# Patient Record
Sex: Female | Born: 1955 | Race: Black or African American | Hispanic: No | Marital: Married | Smoking: Former smoker
Health system: Southern US, Community
[De-identification: ages and names within clinical notes are randomized; demographics above are authoritative.]

## PROBLEM LIST (undated history)

## (undated) DIAGNOSIS — I10 Essential (primary) hypertension: Secondary | ICD-10-CM

## (undated) DIAGNOSIS — M25579 Pain in unspecified ankle and joints of unspecified foot: Secondary | ICD-10-CM

## (undated) DIAGNOSIS — R319 Hematuria, unspecified: Secondary | ICD-10-CM

## (undated) DIAGNOSIS — M51369 Other intervertebral disc degeneration, lumbar region without mention of lumbar back pain or lower extremity pain: Secondary | ICD-10-CM

## (undated) DIAGNOSIS — R1310 Dysphagia, unspecified: Secondary | ICD-10-CM

## (undated) DIAGNOSIS — M79603 Pain in arm, unspecified: Secondary | ICD-10-CM

## (undated) DIAGNOSIS — N200 Calculus of kidney: Secondary | ICD-10-CM

## (undated) DIAGNOSIS — M7660 Achilles tendinitis, unspecified leg: Secondary | ICD-10-CM

## (undated) DIAGNOSIS — G4700 Insomnia, unspecified: Secondary | ICD-10-CM

## (undated) DIAGNOSIS — F32A Depression, unspecified: Secondary | ICD-10-CM

## (undated) DIAGNOSIS — J309 Allergic rhinitis, unspecified: Secondary | ICD-10-CM

## (undated) DIAGNOSIS — Z8249 Family history of ischemic heart disease and other diseases of the circulatory system: Secondary | ICD-10-CM

## (undated) DIAGNOSIS — R0789 Other chest pain: Secondary | ICD-10-CM

## (undated) DIAGNOSIS — M5136 Other intervertebral disc degeneration, lumbar region: Secondary | ICD-10-CM

## (undated) DIAGNOSIS — E669 Obesity, unspecified: Secondary | ICD-10-CM

## (undated) DIAGNOSIS — G44009 Cluster headache syndrome, unspecified, not intractable: Secondary | ICD-10-CM

## (undated) DIAGNOSIS — E039 Hypothyroidism, unspecified: Secondary | ICD-10-CM

## (undated) DIAGNOSIS — N83209 Unspecified ovarian cyst, unspecified side: Secondary | ICD-10-CM

## (undated) DIAGNOSIS — K219 Gastro-esophageal reflux disease without esophagitis: Secondary | ICD-10-CM

## (undated) DIAGNOSIS — G5600 Carpal tunnel syndrome, unspecified upper limb: Secondary | ICD-10-CM

## (undated) DIAGNOSIS — Z780 Asymptomatic menopausal state: Secondary | ICD-10-CM

## (undated) DIAGNOSIS — E119 Type 2 diabetes mellitus without complications: Secondary | ICD-10-CM

## (undated) HISTORY — DX: Asymptomatic menopausal state: Z78.0

## (undated) HISTORY — DX: Allergic rhinitis, unspecified: J30.9

## (undated) HISTORY — DX: Obesity, unspecified: E66.9

## (undated) HISTORY — DX: Calculus of kidney: N20.0

## (undated) HISTORY — PX: REPLACEMENT TOTAL KNEE: SUR1224

## (undated) HISTORY — DX: Dysphagia, unspecified: R13.10

## (undated) HISTORY — DX: Insomnia, unspecified: G47.00

## (undated) HISTORY — DX: Achilles tendinitis, unspecified leg: M76.60

## (undated) HISTORY — DX: Other intervertebral disc degeneration, lumbar region without mention of lumbar back pain or lower extremity pain: M51.369

## (undated) HISTORY — DX: Family history of ischemic heart disease and other diseases of the circulatory system: Z82.49

## (undated) HISTORY — DX: Unspecified ovarian cyst, unspecified side: N83.209

## (undated) HISTORY — DX: Hematuria, unspecified: R31.9

## (undated) HISTORY — DX: Pain in unspecified ankle and joints of unspecified foot: M25.579

## (undated) HISTORY — DX: Depression, unspecified: F32.A

## (undated) HISTORY — DX: Other chest pain: R07.89

## (undated) HISTORY — DX: Other intervertebral disc degeneration, lumbar region: M51.36

## (undated) HISTORY — DX: Cluster headache syndrome, unspecified, not intractable: G44.009

## (undated) HISTORY — PX: LAPAROSCOPIC TOTAL HYSTERECTOMY: SUR800

## (undated) HISTORY — DX: Pain in arm, unspecified: M79.603

## (undated) HISTORY — DX: Gastro-esophageal reflux disease without esophagitis: K21.9

## (undated) HISTORY — DX: Carpal tunnel syndrome, unspecified upper limb: G56.00

---

## 2002-09-26 ENCOUNTER — Encounter: Admission: RE | Admit: 2002-09-26 | Discharge: 2002-12-25 | Payer: Self-pay | Admitting: Internal Medicine

## 2002-12-26 ENCOUNTER — Encounter: Admission: RE | Admit: 2002-12-26 | Discharge: 2003-03-26 | Payer: Self-pay | Admitting: Internal Medicine

## 2004-01-28 ENCOUNTER — Other Ambulatory Visit: Admission: RE | Admit: 2004-01-28 | Discharge: 2004-01-28 | Payer: Self-pay | Admitting: Internal Medicine

## 2007-05-03 ENCOUNTER — Encounter: Admission: RE | Admit: 2007-05-03 | Discharge: 2007-05-03 | Payer: Self-pay | Admitting: Internal Medicine

## 2008-12-26 ENCOUNTER — Encounter: Admission: RE | Admit: 2008-12-26 | Discharge: 2008-12-26 | Payer: Self-pay | Admitting: Internal Medicine

## 2009-03-19 ENCOUNTER — Encounter: Admission: RE | Admit: 2009-03-19 | Discharge: 2009-03-19 | Payer: Self-pay | Admitting: Internal Medicine

## 2010-09-24 ENCOUNTER — Encounter: Admission: RE | Admit: 2010-09-24 | Discharge: 2010-09-24 | Payer: Self-pay | Admitting: Internal Medicine

## 2010-11-15 ENCOUNTER — Encounter: Payer: Self-pay | Admitting: Internal Medicine

## 2011-10-04 ENCOUNTER — Other Ambulatory Visit: Payer: Self-pay | Admitting: Obstetrics and Gynecology

## 2011-10-04 DIAGNOSIS — Z1231 Encounter for screening mammogram for malignant neoplasm of breast: Secondary | ICD-10-CM

## 2011-10-14 ENCOUNTER — Ambulatory Visit: Payer: Self-pay

## 2011-11-01 ENCOUNTER — Ambulatory Visit
Admission: RE | Admit: 2011-11-01 | Discharge: 2011-11-01 | Disposition: A | Discharge status: Home / Self Care | Payer: PRIVATE HEALTH INSURANCE | Source: Ambulatory Visit | Attending: Obstetrics and Gynecology | Admitting: Obstetrics and Gynecology

## 2011-11-01 DIAGNOSIS — Z1231 Encounter for screening mammogram for malignant neoplasm of breast: Secondary | ICD-10-CM

## 2011-11-05 ENCOUNTER — Other Ambulatory Visit: Payer: Self-pay | Admitting: Obstetrics and Gynecology

## 2011-11-05 DIAGNOSIS — R928 Other abnormal and inconclusive findings on diagnostic imaging of breast: Secondary | ICD-10-CM

## 2011-11-17 ENCOUNTER — Ambulatory Visit
Admission: RE | Admit: 2011-11-17 | Discharge: 2011-11-17 | Disposition: A | Discharge status: Home / Self Care | Payer: PRIVATE HEALTH INSURANCE | Source: Ambulatory Visit | Attending: Obstetrics and Gynecology | Admitting: Obstetrics and Gynecology

## 2011-11-17 DIAGNOSIS — R928 Other abnormal and inconclusive findings on diagnostic imaging of breast: Secondary | ICD-10-CM

## 2013-01-24 ENCOUNTER — Other Ambulatory Visit: Payer: Self-pay

## 2013-01-24 DIAGNOSIS — Z1231 Encounter for screening mammogram for malignant neoplasm of breast: Secondary | ICD-10-CM

## 2013-01-30 ENCOUNTER — Ambulatory Visit
Admission: RE | Admit: 2013-01-30 | Discharge: 2013-01-30 | Disposition: A | Discharge status: Home / Self Care | Payer: BC Managed Care – PPO | Source: Ambulatory Visit

## 2013-01-30 DIAGNOSIS — Z1231 Encounter for screening mammogram for malignant neoplasm of breast: Secondary | ICD-10-CM

## 2013-11-16 ENCOUNTER — Other Ambulatory Visit: Payer: Self-pay

## 2013-11-16 DIAGNOSIS — Z1231 Encounter for screening mammogram for malignant neoplasm of breast: Secondary | ICD-10-CM

## 2014-02-01 ENCOUNTER — Ambulatory Visit
Admission: RE | Admit: 2014-02-01 | Discharge: 2014-02-01 | Disposition: A | Discharge status: Home / Self Care | Payer: BC Managed Care – PPO | Source: Ambulatory Visit

## 2014-02-01 DIAGNOSIS — Z1231 Encounter for screening mammogram for malignant neoplasm of breast: Secondary | ICD-10-CM

## 2014-02-14 ENCOUNTER — Ambulatory Visit
Admission: RE | Admit: 2014-02-14 | Discharge: 2014-02-14 | Disposition: A | Discharge status: Home / Self Care | Payer: BC Managed Care – PPO | Source: Ambulatory Visit | Attending: Internal Medicine | Admitting: Internal Medicine

## 2014-02-14 ENCOUNTER — Other Ambulatory Visit: Payer: Self-pay | Admitting: Internal Medicine

## 2014-02-14 DIAGNOSIS — M79609 Pain in unspecified limb: Secondary | ICD-10-CM

## 2014-02-20 ENCOUNTER — Other Ambulatory Visit: Payer: Self-pay | Admitting: Internal Medicine

## 2014-02-20 DIAGNOSIS — M5412 Radiculopathy, cervical region: Secondary | ICD-10-CM

## 2014-02-23 ENCOUNTER — Other Ambulatory Visit: Payer: BC Managed Care – PPO

## 2014-03-02 ENCOUNTER — Ambulatory Visit
Admission: RE | Admit: 2014-03-02 | Discharge: 2014-03-02 | Disposition: A | Discharge status: Home / Self Care | Payer: BC Managed Care – PPO | Source: Ambulatory Visit | Attending: Internal Medicine | Admitting: Internal Medicine

## 2014-03-02 DIAGNOSIS — M5412 Radiculopathy, cervical region: Secondary | ICD-10-CM

## 2014-12-04 ENCOUNTER — Other Ambulatory Visit: Payer: Self-pay | Admitting: Gastroenterology

## 2014-12-04 DIAGNOSIS — R1310 Dysphagia, unspecified: Secondary | ICD-10-CM

## 2014-12-06 ENCOUNTER — Ambulatory Visit
Admission: RE | Admit: 2014-12-06 | Discharge: 2014-12-06 | Disposition: A | Discharge status: Home / Self Care | Payer: BLUE CROSS/BLUE SHIELD | Source: Ambulatory Visit | Attending: Gastroenterology | Admitting: Gastroenterology

## 2014-12-06 DIAGNOSIS — R1310 Dysphagia, unspecified: Secondary | ICD-10-CM

## 2014-12-09 ENCOUNTER — Other Ambulatory Visit: Payer: Self-pay | Admitting: Gastroenterology

## 2014-12-30 ENCOUNTER — Encounter (HOSPITAL_COMMUNITY): Payer: Self-pay

## 2014-12-30 ENCOUNTER — Ambulatory Visit (HOSPITAL_COMMUNITY): Admit: 2014-12-30 | Payer: Self-pay | Admitting: Gastroenterology

## 2014-12-30 SURGERY — EGD (ESOPHAGOGASTRODUODENOSCOPY)
Anesthesia: Moderate Sedation

## 2015-02-26 ENCOUNTER — Other Ambulatory Visit: Payer: Self-pay

## 2015-02-26 DIAGNOSIS — Z1231 Encounter for screening mammogram for malignant neoplasm of breast: Secondary | ICD-10-CM

## 2015-03-04 ENCOUNTER — Ambulatory Visit
Admission: RE | Admit: 2015-03-04 | Discharge: 2015-03-04 | Disposition: A | Discharge status: Home / Self Care | Payer: BLUE CROSS/BLUE SHIELD | Source: Ambulatory Visit

## 2015-03-04 DIAGNOSIS — Z1231 Encounter for screening mammogram for malignant neoplasm of breast: Secondary | ICD-10-CM

## 2016-02-03 ENCOUNTER — Other Ambulatory Visit: Payer: Self-pay

## 2016-02-03 DIAGNOSIS — Z1231 Encounter for screening mammogram for malignant neoplasm of breast: Secondary | ICD-10-CM

## 2016-03-04 ENCOUNTER — Ambulatory Visit
Admission: RE | Admit: 2016-03-04 | Discharge: 2016-03-04 | Disposition: A | Discharge status: Home / Self Care | Payer: BLUE CROSS/BLUE SHIELD | Source: Ambulatory Visit

## 2016-03-04 DIAGNOSIS — Z1231 Encounter for screening mammogram for malignant neoplasm of breast: Secondary | ICD-10-CM

## 2017-02-10 ENCOUNTER — Other Ambulatory Visit: Payer: Self-pay | Admitting: Obstetrics and Gynecology

## 2017-02-10 DIAGNOSIS — Z1231 Encounter for screening mammogram for malignant neoplasm of breast: Secondary | ICD-10-CM

## 2017-03-07 ENCOUNTER — Ambulatory Visit
Admission: RE | Admit: 2017-03-07 | Discharge: 2017-03-07 | Disposition: A | Discharge status: Home / Self Care | Payer: No Typology Code available for payment source | Source: Ambulatory Visit | Attending: Obstetrics and Gynecology | Admitting: Obstetrics and Gynecology

## 2017-03-07 DIAGNOSIS — Z1231 Encounter for screening mammogram for malignant neoplasm of breast: Secondary | ICD-10-CM

## 2017-08-23 ENCOUNTER — Other Ambulatory Visit: Payer: Self-pay | Admitting: Neurological Surgery

## 2017-08-23 DIAGNOSIS — M542 Cervicalgia: Secondary | ICD-10-CM

## 2017-09-03 ENCOUNTER — Ambulatory Visit
Admission: RE | Admit: 2017-09-03 | Discharge: 2017-09-03 | Disposition: A | Discharge status: Home / Self Care | Payer: No Typology Code available for payment source | Source: Ambulatory Visit | Attending: Neurological Surgery | Admitting: Neurological Surgery

## 2017-09-03 DIAGNOSIS — M542 Cervicalgia: Secondary | ICD-10-CM

## 2018-03-06 ENCOUNTER — Other Ambulatory Visit: Payer: Self-pay | Admitting: Internal Medicine

## 2018-03-06 DIAGNOSIS — Z1231 Encounter for screening mammogram for malignant neoplasm of breast: Secondary | ICD-10-CM

## 2018-03-22 ENCOUNTER — Ambulatory Visit
Admission: RE | Admit: 2018-03-22 | Discharge: 2018-03-22 | Disposition: A | Discharge status: Home / Self Care | Payer: PRIVATE HEALTH INSURANCE | Source: Ambulatory Visit | Attending: Internal Medicine | Admitting: Internal Medicine

## 2018-03-22 DIAGNOSIS — Z1231 Encounter for screening mammogram for malignant neoplasm of breast: Secondary | ICD-10-CM

## 2018-04-05 DIAGNOSIS — M7660 Achilles tendinitis, unspecified leg: Secondary | ICD-10-CM | POA: Insufficient documentation

## 2019-03-01 ENCOUNTER — Other Ambulatory Visit: Payer: Self-pay | Admitting: Internal Medicine

## 2019-03-01 DIAGNOSIS — Z1231 Encounter for screening mammogram for malignant neoplasm of breast: Secondary | ICD-10-CM

## 2019-04-23 ENCOUNTER — Ambulatory Visit: Payer: PRIVATE HEALTH INSURANCE

## 2019-04-24 ENCOUNTER — Other Ambulatory Visit: Payer: Self-pay

## 2019-04-24 ENCOUNTER — Ambulatory Visit
Admission: RE | Admit: 2019-04-24 | Discharge: 2019-04-24 | Disposition: A | Discharge status: Home / Self Care | Payer: PRIVATE HEALTH INSURANCE | Source: Ambulatory Visit | Attending: Internal Medicine | Admitting: Internal Medicine

## 2019-04-24 DIAGNOSIS — Z1231 Encounter for screening mammogram for malignant neoplasm of breast: Secondary | ICD-10-CM

## 2019-12-31 DIAGNOSIS — M1711 Unilateral primary osteoarthritis, right knee: Secondary | ICD-10-CM | POA: Insufficient documentation

## 2019-12-31 HISTORY — DX: Unilateral primary osteoarthritis, right knee: M17.11

## 2020-02-04 ENCOUNTER — Other Ambulatory Visit (HOSPITAL_COMMUNITY): Payer: Self-pay | Admitting: Internal Medicine

## 2020-03-21 ENCOUNTER — Other Ambulatory Visit (HOSPITAL_COMMUNITY): Payer: Self-pay | Admitting: Internal Medicine

## 2020-04-03 ENCOUNTER — Other Ambulatory Visit: Payer: Self-pay | Admitting: Internal Medicine

## 2020-04-03 DIAGNOSIS — Z1231 Encounter for screening mammogram for malignant neoplasm of breast: Secondary | ICD-10-CM

## 2020-04-24 ENCOUNTER — Ambulatory Visit
Admission: RE | Admit: 2020-04-24 | Discharge: 2020-04-24 | Disposition: A | Discharge status: Home / Self Care | Payer: PRIVATE HEALTH INSURANCE | Source: Ambulatory Visit | Attending: Internal Medicine | Admitting: Internal Medicine

## 2020-04-24 ENCOUNTER — Other Ambulatory Visit: Payer: Self-pay

## 2020-04-24 DIAGNOSIS — Z1231 Encounter for screening mammogram for malignant neoplasm of breast: Secondary | ICD-10-CM

## 2020-05-16 ENCOUNTER — Other Ambulatory Visit (HOSPITAL_COMMUNITY): Payer: Self-pay | Admitting: Internal Medicine

## 2020-08-19 ENCOUNTER — Other Ambulatory Visit (HOSPITAL_COMMUNITY): Payer: Self-pay | Admitting: Internal Medicine

## 2020-08-25 ENCOUNTER — Other Ambulatory Visit (HOSPITAL_COMMUNITY): Payer: Self-pay | Admitting: Internal Medicine

## 2020-09-29 ENCOUNTER — Other Ambulatory Visit (HOSPITAL_COMMUNITY): Payer: Self-pay | Admitting: Internal Medicine

## 2020-10-01 ENCOUNTER — Other Ambulatory Visit (HOSPITAL_COMMUNITY): Payer: Self-pay | Admitting: Internal Medicine

## 2020-10-01 ENCOUNTER — Ambulatory Visit: Payer: PRIVATE HEALTH INSURANCE

## 2020-10-01 ENCOUNTER — Ambulatory Visit: Payer: PRIVATE HEALTH INSURANCE | Attending: Internal Medicine

## 2020-10-01 DIAGNOSIS — Z23 Encounter for immunization: Secondary | ICD-10-CM

## 2020-10-01 NOTE — Progress Notes (Signed)
   Covid-19 Vaccination Clinic  Name:  Katie Jefferson    MRN: [id number] DOB: 12/18/1955  10/01/2020  [name] was observed post Covid-19 immunization for 15 minutes without incident. She was provided with Vaccine Information Sheet and instruction to access the V-Safe system.   Vaccinated by [facility]  [name] was instructed to call 911 with any severe reactions post vaccine: Marland Kitchen Difficulty breathing  . Swelling of face and throat  . A fast heartbeat  . A bad rash all over body  . Dizziness and weakness   Immunizations Administered    Name Date Dose VIS Date Route   Pfizer COVID-19 Vaccine 10/01/2020 10:15 AM 0.3 mL 08/13/2020 Intramuscular   Manufacturer: [employer], [employer]   Lot: [id number]   NDC: [address]-4

## 2020-11-19 ENCOUNTER — Other Ambulatory Visit (HOSPITAL_COMMUNITY): Payer: Self-pay | Admitting: Internal Medicine

## 2020-12-15 ENCOUNTER — Other Ambulatory Visit (HOSPITAL_COMMUNITY): Payer: Self-pay | Admitting: Internal Medicine

## 2021-01-22 ENCOUNTER — Other Ambulatory Visit (HOSPITAL_COMMUNITY): Payer: Self-pay | Admitting: Gastroenterology

## 2021-02-03 ENCOUNTER — Other Ambulatory Visit (HOSPITAL_COMMUNITY): Payer: Self-pay

## 2021-02-04 ENCOUNTER — Other Ambulatory Visit (HOSPITAL_COMMUNITY): Payer: Self-pay

## 2021-02-05 ENCOUNTER — Other Ambulatory Visit (HOSPITAL_COMMUNITY): Payer: Self-pay

## 2021-02-05 MED ORDER — LEVOTHYROXINE SODIUM 75 MCG PO TABS
ORAL_TABLET | ORAL | 3 refills | Status: DC
  Filled 2021-02-05: qty 90, 90d supply, fill #0
  Filled 2021-05-08: qty 90, 90d supply, fill #1
  Filled 2021-08-25: qty 90, 90d supply, fill #2
  Filled 2021-11-27: qty 90, 90d supply, fill #0

## 2021-02-13 ENCOUNTER — Other Ambulatory Visit (HOSPITAL_COMMUNITY): Payer: Self-pay

## 2021-02-27 ENCOUNTER — Other Ambulatory Visit (HOSPITAL_COMMUNITY): Payer: Self-pay

## 2021-02-27 MED FILL — Valsartan-Hydrochlorothiazide Tab 80-12.5 MG: ORAL | 60 days supply | Qty: 60 | Fill #0 | Status: AC

## 2021-02-27 MED FILL — Cetirizine HCl Tab 10 MG: ORAL | 30 days supply | Qty: 30 | Fill #0 | Status: AC

## 2021-03-19 ENCOUNTER — Other Ambulatory Visit (HOSPITAL_COMMUNITY): Payer: Self-pay

## 2021-03-19 MED ORDER — AMLODIPINE BESYLATE 2.5 MG PO TABS
2.5000 mg | ORAL_TABLET | Freq: Every day | ORAL | 3 refills | Status: DC
  Filled 2021-03-19: qty 90, 90d supply, fill #0
  Filled 2021-07-03 – 2021-07-06 (×2): qty 90, 90d supply, fill #1
  Filled 2021-10-12: qty 90, 90d supply, fill #2
  Filled 2022-01-22: qty 90, 90d supply, fill #0

## 2021-04-15 ENCOUNTER — Other Ambulatory Visit (HOSPITAL_COMMUNITY): Payer: Self-pay

## 2021-04-15 MED ORDER — CETIRIZINE HCL 10 MG PO TABS
10.0000 mg | ORAL_TABLET | Freq: Every day | ORAL | 3 refills | Status: DC | PRN
  Filled 2021-04-15: qty 90, 90d supply, fill #0
  Filled 2021-08-03 – 2021-08-25 (×2): qty 30, 30d supply, fill #1

## 2021-04-15 MED FILL — Omeprazole Cap Delayed Release 20 MG: ORAL | 90 days supply | Qty: 90 | Fill #0 | Status: AC

## 2021-04-17 ENCOUNTER — Other Ambulatory Visit: Payer: Self-pay | Admitting: Internal Medicine

## 2021-04-17 DIAGNOSIS — Z1231 Encounter for screening mammogram for malignant neoplasm of breast: Secondary | ICD-10-CM

## 2021-04-30 ENCOUNTER — Other Ambulatory Visit (HOSPITAL_COMMUNITY): Payer: Self-pay

## 2021-04-30 MED ORDER — BUSPIRONE HCL 7.5 MG PO TABS
ORAL_TABLET | ORAL | 2 refills | Status: DC
  Filled 2021-04-30: qty 60, 30d supply, fill #0
  Filled 2021-06-16: qty 60, 30d supply, fill #1
  Filled 2021-08-25: qty 60, 30d supply, fill #2

## 2021-05-04 ENCOUNTER — Other Ambulatory Visit (HOSPITAL_COMMUNITY): Payer: Self-pay

## 2021-05-04 MED FILL — Valsartan-Hydrochlorothiazide Tab 80-12.5 MG: ORAL | 60 days supply | Qty: 60 | Fill #1 | Status: AC

## 2021-05-08 ENCOUNTER — Other Ambulatory Visit (HOSPITAL_COMMUNITY): Payer: Self-pay

## 2021-05-16 ENCOUNTER — Emergency Department (HOSPITAL_BASED_OUTPATIENT_CLINIC_OR_DEPARTMENT_OTHER): Payer: No Typology Code available for payment source

## 2021-05-16 ENCOUNTER — Other Ambulatory Visit: Payer: Self-pay

## 2021-05-16 ENCOUNTER — Emergency Department (HOSPITAL_BASED_OUTPATIENT_CLINIC_OR_DEPARTMENT_OTHER)
Admission: EM | Admit: 2021-05-16 | Discharge: 2021-05-16 | Disposition: A | Discharge status: Home / Self Care | Payer: No Typology Code available for payment source | Attending: Emergency Medicine | Admitting: Emergency Medicine

## 2021-05-16 ENCOUNTER — Encounter (HOSPITAL_BASED_OUTPATIENT_CLINIC_OR_DEPARTMENT_OTHER): Payer: Self-pay

## 2021-05-16 DIAGNOSIS — Z79899 Other long term (current) drug therapy: Secondary | ICD-10-CM | POA: Insufficient documentation

## 2021-05-16 DIAGNOSIS — M25531 Pain in right wrist: Secondary | ICD-10-CM | POA: Diagnosis not present

## 2021-05-16 DIAGNOSIS — Z91040 Latex allergy status: Secondary | ICD-10-CM | POA: Diagnosis not present

## 2021-05-16 DIAGNOSIS — I10 Essential (primary) hypertension: Secondary | ICD-10-CM | POA: Insufficient documentation

## 2021-05-16 HISTORY — DX: Essential (primary) hypertension: I10

## 2021-05-16 NOTE — Discharge Instructions (Addendum)
Try taking anti-inflammatories such as ibuprofen for pain.  If you develop markedly worsened pain, any skin redness, or any physical symptoms such as fatigue, nausea, fever, please seek care immediately.

## 2021-05-16 NOTE — ED Triage Notes (Signed)
Right wrist/hand pain started Thursday, denies injury.

## 2021-05-16 NOTE — ED Provider Notes (Signed)
[facility] EMERGENCY DEPARTMENT Provider Note   CSN: [id number] Arrival date & time: 05/16/21  3007     History Chief Complaint  Patient presents with   Wrist Pain    Katie Jefferson is a 65 y.o. female.  Katie Jefferson is right-handed and types at work.  Katie Jefferson developed right wrist pain somewhat diffusely several days ago.  Katie Jefferson reports no systemic symptoms.   The history is provided by the patient.  Wrist Pain This is a new problem. Episode onset: 3 days ago. The problem occurs constantly. The problem has not changed since onset.Pertinent negatives include no chest pain, no abdominal pain, no headaches and no shortness of breath. Exacerbated by: cold temperatures, movement. Nothing relieves the symptoms. Treatments tried: soft brace. The treatment provided mild relief.      Past Medical History:  Diagnosis Date   Hypertension     There are no problems to display for this patient.   History reviewed. No pertinent surgical history.   OB History   No obstetric history on file.     Family History  Problem Relation Age of Onset   Breast cancer Paternal Aunt     Social History   Tobacco Use   Smoking status: Never   Smokeless tobacco: Never    Home Medications Prior to Admission medications   Medication Sig Start Date End Date Taking? Authorizing Provider  amLODipine (NORVASC) 2.5 MG tablet TAKE 1 TABLET BY MOUTH EVERY DAY 02/04/20 02/03/21  Marden Noble, MD  amLODipine (NORVASC) 2.5 MG tablet TAKE 1 TABLET BY MOUTH EVERY DAY 03/19/21     amoxicillin (AMOXIL) 500 MG capsule TAKE 1 CAPSULE BY MOUTH THREE TIMES A DAY FOR 5 DAYS 12/15/20 12/15/21  Marden Noble, MD  amoxicillin (AMOXIL) 500 MG capsule TAKE 1 CAPSULE BY MOUTH TWICE A DAY FOR 7 DAYS 11/19/20 11/19/21  Marden Noble, MD  amoxicillin (AMOXIL) 500 MG capsule TAKE 1 CAPSULE BY MOUTH TWICE A DAY FOR 5 DAYS 08/19/20 08/19/21  Marden Noble, MD  busPIRone (BUSPAR) 7.5 MG tablet Take 1 tablet by mouth twice a  day as needed for anxiety 30 days Patient taking differently: 7.5 mg 2 (two) times daily. 04/30/21     cetirizine (ZYRTEC) 10 MG tablet TAKE 1 TABLET BY MOUTH DAILY AS NEEDED 04/15/21     COVID-19 mRNA vaccine, Pfizer, 30 MCG/0.3ML injection USE AS DIRECTED 10/01/20 10/01/21  Judyann Munson, MD  escitalopram (LEXAPRO) 10 MG tablet TAKE 1 TABLET BY MOUTH EVERY DAY 90 05/16/20 05/16/21  Marden Noble, MD  levothyroxine (SYNTHROID) 75 MCG tablet TAKE 1 TABLET BY MOUTH EVERY DAY IN THE MORNING ON AN EMPTY STOMACH 03/21/20 03/21/21  Marden Noble, MD  levothyroxine (SYNTHROID) 75 MCG tablet TAKE 1 TABLET BY MOUTH EVERY DAY IN THE MORNING ON AN EMPTY STOMACH 90 days 02/05/21     omeprazole (PRILOSEC) 20 MG capsule TAKE 1 CAPSULE BY MOUTH DAILY 01/22/21 01/22/22  Charlott Rakes, MD  valsartan-hydrochlorothiazide (DIOVAN-HCT) 80-12.5 MG tablet TAKE 1 TABLET BY MOUTH EVERY DAY 08/25/20 08/25/21  Marden Noble, MD    Allergies    Latex and Hydrocodone  Review of Systems   Review of Systems  Constitutional:  Negative for chills and fever.  HENT:  Negative for ear pain and sore throat.   Eyes:  Negative for pain and visual disturbance.  Respiratory:  Negative for cough and shortness of breath.   Cardiovascular:  Negative for chest pain and palpitations.  Gastrointestinal:  Negative for abdominal pain and vomiting.  Genitourinary:  Negative for dysuria and hematuria.  Musculoskeletal:  Negative for arthralgias and back pain.  Skin:  Negative for color change and rash.  Neurological:  Negative for seizures, syncope and headaches.  All other systems reviewed and are negative.  Physical Exam Updated Vital Signs BP 127/83 (BP Location: Left Arm)   Pulse 73   Temp 98.4 F (36.9 C) (Oral)   Resp 20   Ht 5' 5.5" (1.664 m)   Wt 99.8 kg   SpO2 100%   BMI 36.05 kg/m   Physical Exam Vitals and nursing note reviewed.  HENT:     Head: Normocephalic and atraumatic.  Eyes:     General: No scleral  icterus. Pulmonary:     Effort: Pulmonary effort is normal. No respiratory distress.  Musculoskeletal:     Cervical back: Normal range of motion.     Comments: Right wrist is mildly and diffusely swollen.  There is tenderness to palpation over the wrist joint with increased pain with flexion and extension of the wrist.  Negative Finkelstein sign.  Normal radial and ulnar pulses.  Sensation is normal.  Skin:    General: Skin is warm and dry.  Neurological:     Mental Status: Katie Jefferson is alert.  Psychiatric:        Mood and Affect: Mood normal.    ED Results / Procedures / Treatments   Labs (all labs ordered are listed, but only abnormal results are displayed) Labs Reviewed - No data to display  EKG None  Radiology DG Wrist Complete Right  Result Date: 05/16/2021 CLINICAL DATA:  Atraumatic wrist pain EXAM: RIGHT WRIST - COMPLETE 3+ VIEW COMPARISON:  None. FINDINGS: There is no evidence of fracture or dislocation. No spurring or erosion seen at the wrist. Mild degenerative spurring at the first MCP joint. IMPRESSION: No acute finding. Electronically Signed   By: Marnee Spring M.D.   On: 05/16/2021 09:25    Procedures Procedures   Medications Ordered in ED Medications - No data to display  ED Course  I have reviewed the triage vital signs and the nursing notes.  Pertinent labs & imaging results that were available during my care of the patient were reviewed by me and considered in my medical decision making (see chart for details).    MDM Rules/Calculators/A&P                           Jonnell Hentges presents with several days of breast pain.  It appears that Katie Jefferson has a mild wrist effusion.  This is likely degenerative in nature although x-rays did not reveal significant arthritis.  Less likely would be septic joint.  Katie Jefferson has no risk factors for this.  No obvious trauma seen at the wrist.  No fever or chills.  Katie Jefferson was advised to manage her symptoms with a wrist splint and use of  anti-inflammatories.  We talked about return precautions.  Other differential considerations were cervical radiculopathy, tendinitis, inflammatory arthropathy. Final Clinical Impression(s) / ED Diagnoses Final diagnoses:  Right wrist pain    Rx / DC Orders ED Discharge Orders     None        Koleen Distance, MD 05/16/21 1023

## 2021-06-10 ENCOUNTER — Ambulatory Visit
Admission: RE | Admit: 2021-06-10 | Discharge: 2021-06-10 | Disposition: A | Discharge status: Home / Self Care | Payer: No Typology Code available for payment source | Source: Ambulatory Visit | Attending: Internal Medicine | Admitting: Internal Medicine

## 2021-06-10 ENCOUNTER — Other Ambulatory Visit: Payer: Self-pay

## 2021-06-10 DIAGNOSIS — Z1231 Encounter for screening mammogram for malignant neoplasm of breast: Secondary | ICD-10-CM

## 2021-06-16 ENCOUNTER — Other Ambulatory Visit (HOSPITAL_BASED_OUTPATIENT_CLINIC_OR_DEPARTMENT_OTHER): Payer: Self-pay

## 2021-07-02 ENCOUNTER — Other Ambulatory Visit (HOSPITAL_BASED_OUTPATIENT_CLINIC_OR_DEPARTMENT_OTHER): Payer: Self-pay

## 2021-07-02 MED ORDER — VALSARTAN-HYDROCHLOROTHIAZIDE 80-12.5 MG PO TABS
ORAL_TABLET | ORAL | 3 refills | Status: AC
  Filled 2021-07-02: qty 90, 90d supply, fill #0
  Filled 2021-10-12: qty 90, 90d supply, fill #1
  Filled 2022-01-22: qty 90, 90d supply, fill #0
  Filled 2022-05-11: qty 90, 90d supply, fill #1

## 2021-07-03 ENCOUNTER — Other Ambulatory Visit (HOSPITAL_BASED_OUTPATIENT_CLINIC_OR_DEPARTMENT_OTHER): Payer: Self-pay

## 2021-07-04 ENCOUNTER — Other Ambulatory Visit (HOSPITAL_COMMUNITY): Payer: Self-pay

## 2021-07-06 ENCOUNTER — Other Ambulatory Visit (HOSPITAL_BASED_OUTPATIENT_CLINIC_OR_DEPARTMENT_OTHER): Payer: Self-pay

## 2021-07-07 ENCOUNTER — Other Ambulatory Visit (HOSPITAL_BASED_OUTPATIENT_CLINIC_OR_DEPARTMENT_OTHER): Payer: Self-pay

## 2021-07-07 MED ORDER — AMOXICILLIN 500 MG PO CAPS
ORAL_CAPSULE | ORAL | 0 refills | Status: DC
  Filled 2021-07-07: qty 10, 5d supply, fill #0

## 2021-08-03 ENCOUNTER — Other Ambulatory Visit (HOSPITAL_BASED_OUTPATIENT_CLINIC_OR_DEPARTMENT_OTHER): Payer: Self-pay

## 2021-08-12 ENCOUNTER — Other Ambulatory Visit (HOSPITAL_BASED_OUTPATIENT_CLINIC_OR_DEPARTMENT_OTHER): Payer: Self-pay

## 2021-08-25 ENCOUNTER — Other Ambulatory Visit (HOSPITAL_BASED_OUTPATIENT_CLINIC_OR_DEPARTMENT_OTHER): Payer: Self-pay

## 2021-10-12 ENCOUNTER — Other Ambulatory Visit (HOSPITAL_BASED_OUTPATIENT_CLINIC_OR_DEPARTMENT_OTHER): Payer: Self-pay

## 2021-10-12 MED ORDER — OMEPRAZOLE 20 MG PO CPDR
20.0000 mg | DELAYED_RELEASE_CAPSULE | Freq: Every day | ORAL | 1 refills | Status: AC
  Filled 2021-10-12: qty 90, 90d supply, fill #0

## 2021-10-12 MED ORDER — CETIRIZINE HCL 10 MG PO TABS
10.0000 mg | ORAL_TABLET | Freq: Every day | ORAL | 3 refills | Status: AC | PRN
  Filled 2021-10-12 – 2022-02-05 (×2): qty 100, 100d supply, fill #0

## 2021-11-05 ENCOUNTER — Other Ambulatory Visit: Payer: Self-pay

## 2021-11-05 ENCOUNTER — Other Ambulatory Visit (HOSPITAL_COMMUNITY): Payer: Self-pay

## 2021-11-05 MED ORDER — BUSPIRONE HCL 7.5 MG PO TABS
ORAL_TABLET | ORAL | 2 refills | Status: DC
  Filled 2021-11-05: qty 60, 30d supply, fill #0
  Filled 2022-02-05: qty 60, 30d supply, fill #1
  Filled 2022-05-11: qty 60, 30d supply, fill #2

## 2021-11-06 ENCOUNTER — Other Ambulatory Visit (HOSPITAL_COMMUNITY): Payer: Self-pay

## 2021-11-20 ENCOUNTER — Other Ambulatory Visit: Payer: Self-pay

## 2021-11-20 MED ORDER — AMOXICILLIN-POT CLAVULANATE 875-125 MG PO TABS
ORAL_TABLET | ORAL | 0 refills | Status: DC
  Filled 2021-11-20: qty 14, 7d supply, fill #0

## 2021-11-27 ENCOUNTER — Other Ambulatory Visit: Payer: Self-pay

## 2022-01-20 ENCOUNTER — Other Ambulatory Visit (HOSPITAL_COMMUNITY): Payer: Self-pay

## 2022-01-22 ENCOUNTER — Other Ambulatory Visit: Payer: Self-pay

## 2022-01-26 ENCOUNTER — Other Ambulatory Visit: Payer: Self-pay

## 2022-02-05 ENCOUNTER — Other Ambulatory Visit: Payer: Self-pay

## 2022-02-08 ENCOUNTER — Other Ambulatory Visit: Payer: Self-pay

## 2022-03-02 ENCOUNTER — Other Ambulatory Visit: Payer: Self-pay

## 2022-03-02 MED ORDER — LEVOTHYROXINE SODIUM 75 MCG PO TABS
ORAL_TABLET | ORAL | 3 refills | Status: AC
  Filled 2022-03-02: qty 90, 90d supply, fill #0

## 2022-03-04 ENCOUNTER — Other Ambulatory Visit: Payer: Self-pay

## 2022-05-07 ENCOUNTER — Other Ambulatory Visit: Payer: Self-pay | Admitting: Internal Medicine

## 2022-05-07 DIAGNOSIS — Z1231 Encounter for screening mammogram for malignant neoplasm of breast: Secondary | ICD-10-CM

## 2022-05-11 ENCOUNTER — Other Ambulatory Visit: Payer: Self-pay

## 2022-05-11 MED ORDER — AMLODIPINE BESYLATE 2.5 MG PO TABS
2.5000 mg | ORAL_TABLET | Freq: Every day | ORAL | 3 refills | Status: AC
  Filled 2022-05-11: qty 90, 90d supply, fill #0

## 2022-05-12 ENCOUNTER — Other Ambulatory Visit: Payer: Self-pay

## 2022-06-08 NOTE — Progress Notes (Unsigned)
Cardiology Office Note:    Date:  06/09/2022   ID:  Katie Jefferson, DOB 08-May-1956, MRN [id number]  PCP:  Thana Ates, MD  Cardiologist:  Lesleigh Noe, MD   Referring MD: Marden Noble, MD   Chief Complaint  Patient presents with   Coronary Artery Disease    History of Present Illness:    Katie Jefferson is a 66 y.o. female with a hx of primary hypertension, DM II, and chest tightness and DOE.  2-year history of exertional precordial chest discomfort with associated tingling in her arms.  After walking for 10 to 15 minutes the discomfort resolves.  It does have this pattern since its inception.  She never has the discomfort at rest or during stress.  She denies dyspnea, orthopnea, PND, claudication, palpitations, and syncope.  Past Medical History:  Diagnosis Date   Allergic rhinitis    Carpal tunnel syndrome    Chest tightness    DDD (degenerative disc disease), lumbar    Depression    Dysphagia    Family history of early CAD    GERD (gastroesophageal reflux disease)    Headaches, cluster    Hematuria    Hematuria    Hypertension    Insomnia    Menopause    Obesity    Ovarian cyst    Pain in joint, ankle and foot    Renal calculus    Tendonitis, Achilles    Upper extremity pain     Past Surgical History:  Procedure Laterality Date   LAPAROSCOPIC TOTAL HYSTERECTOMY     REPLACEMENT TOTAL KNEE      Current Medications: Current Meds  Medication Sig   amLODipine (NORVASC) 2.5 MG tablet TAKE 1 TABLET BY MOUTH EVERY DAY   Ashwagandha 500 MG CAPS Take 2 capsules by mouth daily at 6 (six) AM.   aspirin EC 81 MG tablet Take 1 tablet (81 mg total) by mouth daily. Swallow whole.   B Complex Vitamins (VITAMIN B-COMPLEX) TABS Take 1 tablet by mouth daily at 6 (six) AM.   busPIRone (BUSPAR) 7.5 MG tablet Take 1 tablet by mouth twice a day as needed for anxiety   cetirizine (ALLERGY, CETIRIZINE,) 10 MG tablet TAKE 1 TABLET BY MOUTH DAILY AS NEEDED   Cod Liver Oil CAPS  Take 1 capsule by mouth daily at 6 (six) AM.   Coenzyme Q10 (CO Q-10) 400 MG CAPS Take 1 capsule by mouth daily at 6 (six) AM.   fluticasone (FLONASE ALLERGY RELIEF) 50 MCG/ACT nasal spray 1 spray as needed.   levothyroxine (SYNTHROID) 75 MCG tablet TAKE 1 TABLET BY MOUTH EVERY DAY IN THE MORNING ON AN EMPTY STOMACH   metoprolol tartrate (LOPRESSOR) 100 MG tablet Take 1 tablet (100 mg total) by mouth once for 1 dose. Take 90-120 minutes prior to scan.   omeprazole (PRILOSEC) 20 MG capsule TAKE 1 CAPSULE BY MOUTH DAILY   valsartan-hydrochlorothiazide (DIOVAN-HCT) 80-12.5 MG tablet 1 tablet Orally Once a day 90 days     Allergies:   Latex and Hydrocodone   Social History   Socioeconomic History   Marital status: Married    Spouse name: Not on file   Number of children: Not on file   Years of education: Not on file   Highest education level: Not on file  Occupational History   Not on file  Tobacco Use   Smoking status: Former    Types: Cigarettes   Smokeless tobacco: Never  Substance and Sexual Activity  Alcohol use: Not on file   Drug use: Not on file   Sexual activity: Not on file  Other Topics Concern   Not on file  Social History Narrative   Not on file   Social Determinants of Health   Financial Resource Strain: Not on file  Food Insecurity: Not on file  Transportation Needs: Not on file  Physical Activity: Not on file  Stress: Not on file  Social Connections: Not on file     Family History: The patient's family history includes Breast cancer in her paternal aunt; CAD in her father; Cancer - Colon in her paternal grandmother; Diabetes in her brother, sister, sister, and sister; Heart Problems in her father; Heart failure in her sister, sister, and sister; Hyperlipidemia in her father and mother; Hypertension in her father, sister, and sister; Obesity in her mother; Osteoarthritis in her father.  ROS:   Please see the history of present illness.    Prior smoker.   Discontinued 20 years ago.  All other systems reviewed and are negative.  EKGs/Labs/Other Studies Reviewed:    The following studies were reviewed today: No prior cardiac testing  EKG:  EKG today with normal sinus rhythm, horizontal axis, normal in appearance.  There is no prior tracing with which to compare.  Recent Labs: No results found for requested labs within last 365 days.  Recent Lipid Panel No results found for: "CHOL", "TRIG", "HDL", "CHOLHDL", "VLDL", "LDLCALC", "LDLDIRECT"  Physical Exam:    VS:  BP 112/76   Pulse 67   Ht 5' 5.5" (1.664 m)   Wt 211 lb (95.7 kg)   SpO2 95%   BMI 34.58 kg/m     Wt Readings from Last 3 Encounters:  06/09/22 211 lb (95.7 kg)  05/16/21 220 lb (99.8 kg)     GEN: Overweight. No acute distress HEENT: Normal NECK: No JVD. LYMPHATICS: No lymphadenopathy CARDIAC: No murmur. RRR S4 gallop, or edema. VASCULAR:  Normal Pulses. No bruits. RESPIRATORY:  Clear to auscultation without rales, wheezing or rhonchi  ABDOMEN: Soft, non-tender, non-distended, No pulsatile mass, MUSCULOSKELETAL: No deformity  SKIN: Warm and dry NEUROLOGIC:  Alert and oriented x 3 PSYCHIATRIC:  Normal affect   ASSESSMENT:    1. Chest pain of uncertain etiology   2. Primary hypertension   3. Controlled type 2 diabetes mellitus with other circulatory complication, without long-term current use of insulin (HCC)   4. Hyperlipidemia LDL goal <70   5. Pre-procedure lab exam   6. Chest pain, unspecified type    PLAN:    In order of problems listed above:  Likely angina pectoris.  Plan coronary CT angiography with FFR if indicated.  Start 81 mg aspirin daily. Continue Diovan HCT 80/12.5 mg daily. Prediabetes.  Control with diet and exercise.  Not on statin therapy.   LDL 136 and total cholesterol 206 3 months ago.  This will need to be treated given.  Overall education and awareness concerning secondary risk prevention was discussed in detail: LDL less than 70,  hemoglobin A1c less than 7, blood pressure target less than 130/80 mmHg, >150 minutes of moderate aerobic activity per week, avoidance of smoking, weight control (via diet and exercise), and continued surveillance/management of/for obstructive sleep apnea.    Medication Adjustments/Labs and Tests Ordered: Current medicines are reviewed at length with the patient today.  Concerns regarding medicines are outlined above.  Orders Placed This Encounter  Procedures   CT CORONARY MORPH W/CTA COR W/SCORE W/CA W/CM &/OR WO/CM  Basic metabolic panel   EKG 12-Lead   Meds ordered this encounter  Medications   metoprolol tartrate (LOPRESSOR) 100 MG tablet    Sig: Take 1 tablet (100 mg total) by mouth once for 1 dose. Take 90-120 minutes prior to scan.    Dispense:  1 tablet    Refill:  0   aspirin EC 81 MG tablet    Sig: Take 1 tablet (81 mg total) by mouth daily. Swallow whole.    Patient Instructions  Medication Instructions:  Your physician has recommended you make the following change in your medication:   1) START Aspirin (enteric coated) 81mg  daily  *If you need a refill on your cardiac medications before your next appointment, please call your pharmacy*  Lab Work: TODAY: BMET If you have labs (blood work) drawn today and your tests are completely normal, you will receive your results only by: MyChart Message (if you have MyChart) OR A paper copy in the mail If you have any lab test that is abnormal or we need to change your treatment, we will call you to review the results.  Testing/Procedures: Your physician has requested you have a coronary CTA performed.  Follow-Up: At [facility], you and your health needs are our priority.  As part of our continuing mission to provide you with exceptional heart care, we have created designated Provider Care Teams.  These Care Teams include your primary Cardiologist (physician) and Advanced Practice Providers (APPs -  Physician Assistants  and Nurse Practitioners) who all work together to provide you with the care you need, when you need it.  Your next appointment:   1-2 month(s)  The format for your next appointment:   In Person  Provider:   [facility], MD   Other Instructions   Your cardiac CT will be scheduled at the below location:   [facility] [address] [place], [place] [phone]  Please arrive at the [facility] and Children's Entrance (Entrance C2) of [facility] 30 minutes prior to test start time. You can use the FREE valet parking offered at entrance C (encouraged to control the heart rate for the test)  Proceed to the [facility] Radiology Department (first floor) to check-in and test prep.  All radiology patients and guests should use entrance C2 at [facility], accessed from [facility], even though the hospital's physical address listed is [address].     Please follow these instructions carefully (unless otherwise directed):  On the Night Before the Test: Be sure to Drink plenty of water. Do not consume any caffeinated/decaffeinated beverages or chocolate 12 hours prior to your test. Do not take any antihistamines (cetirizine) 12 hours prior to your test.  On the Day of the Test: Drink plenty of water until 1 hour prior to the test. Do not eat any food 4 hours prior to the test. You may take your regular medications prior to the test.  Take metoprolol (Lopressor) 100mg  two hours prior to test. HOLD valsartan-hydrochlorothiazide morning of the test. FEMALES- please wear underwire-free bra if available, avoid dresses & tight clothing      After the Test: Drink plenty of water. After receiving IV contrast, you may experience a mild flushed feeling. This is normal. On occasion, you may experience a mild rash up to 24 hours after the test. This is not dangerous. If this occurs, you can take Benadryl 25 mg and increase your  fluid intake. If you  experience trouble breathing, this can be serious. If it is severe call 911 IMMEDIATELY. If it is mild, please call our office. If you take any of these medications: Glipizide/Metformin, Avandament, Glucavance, please do not take 48 hours after completing test unless otherwise instructed.  We will call to schedule your test 2-4 weeks out understanding that some insurance companies will need an authorization prior to the service being performed.   For non-scheduling related questions, please contact the cardiac imaging nurse navigator should you have any questions/concerns: Rockwell Alexandria, Cardiac Imaging Nurse Navigator Larey Brick, Cardiac Imaging Nurse Navigator [facility] Direct Office Dial: [phone]   For scheduling needs, including cancellations and rescheduling, please call [place], [phone].   Important Information About Sugar         Signed, Lesleigh Noe, MD  06/09/2022 12:30 PM    [facility]

## 2022-06-09 ENCOUNTER — Encounter: Payer: Self-pay | Admitting: Interventional Cardiology

## 2022-06-09 ENCOUNTER — Ambulatory Visit (INDEPENDENT_AMBULATORY_CARE_PROVIDER_SITE_OTHER): Payer: No Typology Code available for payment source | Admitting: Interventional Cardiology

## 2022-06-09 VITALS — BP 112/76 | HR 67 | Ht 65.5 in | Wt 211.0 lb

## 2022-06-09 DIAGNOSIS — I10 Essential (primary) hypertension: Secondary | ICD-10-CM

## 2022-06-09 DIAGNOSIS — R079 Chest pain, unspecified: Secondary | ICD-10-CM

## 2022-06-09 DIAGNOSIS — E1159 Type 2 diabetes mellitus with other circulatory complications: Secondary | ICD-10-CM

## 2022-06-09 DIAGNOSIS — E785 Hyperlipidemia, unspecified: Secondary | ICD-10-CM

## 2022-06-09 DIAGNOSIS — Z01812 Encounter for preprocedural laboratory examination: Secondary | ICD-10-CM

## 2022-06-09 LAB — BASIC METABOLIC PANEL
BUN/Creatinine Ratio: 26 (ref 12–28)
BUN: 21 mg/dL (ref 8–27)
CO2: 26 mmol/L (ref 20–29)
Calcium: 9.9 mg/dL (ref 8.7–10.3)
Chloride: 99 mmol/L (ref 96–106)
Creatinine, Ser: 0.8 mg/dL (ref 0.57–1.00)
Glucose: 104 mg/dL — ABNORMAL HIGH (ref 70–99)
Potassium: 4 mmol/L (ref 3.5–5.2)
Sodium: 140 mmol/L (ref 134–144)
eGFR: 82 mL/min/{1.73_m2} (ref >59)

## 2022-06-09 MED ORDER — METOPROLOL TARTRATE 100 MG PO TABS: 100.0000 mg | ORAL_TABLET | Freq: Once | ORAL | 0 refills | Status: DC

## 2022-06-09 MED ORDER — ASPIRIN 81 MG PO TBEC: 81.0000 mg | DELAYED_RELEASE_TABLET | Freq: Every day | ORAL | Status: AC

## 2022-06-09 NOTE — Patient Instructions (Addendum)
Medication Instructions:  Your physician has recommended you make the following change in your medication:   1) START Aspirin (enteric coated) 81mg  daily  *If you need a refill on your cardiac medications before your next appointment, please call your pharmacy*  Lab Work: TODAY: BMET If you have labs (blood work) drawn today and your tests are completely normal, you will receive your results only by: MyChart Message (if you have MyChart) OR A paper copy in the mail If you have any lab test that is abnormal or we need to change your treatment, we will call you to review the results.  Testing/Procedures: Your physician has requested you have a coronary CTA performed.  Follow-Up: At [facility], you and your health needs are our priority.  As part of our continuing mission to provide you with exceptional heart care, we have created designated Provider Care Teams.  These Care Teams include your primary Cardiologist (physician) and Advanced Practice Providers (APPs -  Physician Assistants and Nurse Practitioners) who all work together to provide you with the care you need, when you need it.  Your next appointment:   1-2 month(s)  The format for your next appointment:   In Person  Provider:   [facility], MD   Other Instructions   Your cardiac CT will be scheduled at the below location:   [facility] [address] [place], [place] [phone]  Please arrive at the [facility] and Children's Entrance (Entrance C2) of [facility] 30 minutes prior to test start time. You can use the FREE valet parking offered at entrance C (encouraged to control the heart rate for the test)  Proceed to the [facility] Radiology Department (first floor) to check-in and test prep.  All radiology patients and guests should use entrance C2 at [facility], accessed from [facility], even though the hospital's physical address listed is [address].     Please follow these instructions carefully (unless otherwise directed):  On the Night Before the Test: Be sure to Drink plenty of water. Do not consume any caffeinated/decaffeinated beverages or chocolate 12 hours prior to your test. Do not take any antihistamines (cetirizine) 12 hours prior to your test.  On the Day of the Test: Drink plenty of water until 1 hour prior to the test. Do not eat any food 4 hours prior to the test. You may take your regular medications prior to the test.  Take metoprolol (Lopressor) 100mg  two hours prior to test. HOLD valsartan-hydrochlorothiazide morning of the test. FEMALES- please wear underwire-free bra if available, avoid dresses & tight clothing      After the Test: Drink plenty of water. After receiving IV contrast, you may experience a mild flushed feeling. This is normal. On occasion, you may experience a mild rash up to 24 hours after the test. This is not dangerous. If this occurs, you can take Benadryl 25 mg and increase your fluid intake. If you experience trouble breathing, this can be serious. If it is severe call 911 IMMEDIATELY. If it is mild, please call our office. If you take any of these medications: Glipizide/Metformin, Avandament, Glucavance, please do not take 48 hours after completing test unless otherwise instructed.  We will call to schedule your test 2-4 weeks out understanding that some insurance companies will need an authorization prior to the service being performed.   For non-scheduling related questions, please contact the cardiac imaging nurse navigator should you have any  questions/concerns: Rockwell Alexandria, Cardiac Imaging Nurse Navigator Larey Brick, Cardiac Imaging Nurse Navigator [facility] Direct Office Dial: [phone]   For scheduling needs, including cancellations and rescheduling, please call [place], [phone].   Important Information About Sugar

## 2022-06-11 ENCOUNTER — Ambulatory Visit: Payer: No Typology Code available for payment source

## 2022-07-02 ENCOUNTER — Ambulatory Visit: Payer: No Typology Code available for payment source

## 2022-07-28 ENCOUNTER — Telehealth (HOSPITAL_COMMUNITY): Payer: Self-pay | Admitting: Emergency Medicine

## 2022-07-28 NOTE — Telephone Encounter (Signed)
Reaching out to patient to offer assistance regarding upcoming cardiac imaging study; pt verbalizes understanding of appt date/time, parking situation and where to check in, pre-test NPO status and medications ordered, and verified current allergies; name and call back number provided for further questions should they arise Marchia Bond RN Navigator Cardiac Imaging [facility] [phone] office [phone] cell  Arrival 1130 w/c entrance 100mg  metoprolol tartrate Denies iv issues Holding allergy meds and diovan

## 2022-07-29 ENCOUNTER — Ambulatory Visit (HOSPITAL_COMMUNITY)
Admission: RE | Admit: 2022-07-29 | Discharge: 2022-07-29 | Disposition: A | Discharge status: Home / Self Care | Payer: No Typology Code available for payment source | Source: Ambulatory Visit | Attending: Interventional Cardiology | Admitting: Interventional Cardiology

## 2022-07-29 DIAGNOSIS — R079 Chest pain, unspecified: Secondary | ICD-10-CM | POA: Insufficient documentation

## 2022-07-29 MED ORDER — NITROGLYCERIN 0.4 MG SL SUBL
SUBLINGUAL_TABLET | SUBLINGUAL | Status: AC
  Administered 2022-07-29: 0.8 mg via SUBLINGUAL
  Filled 2022-07-29: qty 2

## 2022-07-29 MED ORDER — IOHEXOL 350 MG/ML SOLN
200.0000 mL | Freq: Once | INTRAVENOUS | Status: AC | PRN
  Administered 2022-07-29: 200 mL via INTRAVENOUS

## 2022-07-29 MED ORDER — NITROGLYCERIN 0.4 MG SL SUBL: 0.8000 mg | SUBLINGUAL_TABLET | Freq: Once | SUBLINGUAL | Status: AC

## 2022-08-02 ENCOUNTER — Other Ambulatory Visit: Payer: Self-pay | Admitting: Internal Medicine

## 2022-08-02 DIAGNOSIS — Z1231 Encounter for screening mammogram for malignant neoplasm of breast: Secondary | ICD-10-CM

## 2022-08-04 ENCOUNTER — Telehealth: Payer: Self-pay | Admitting: Interventional Cardiology

## 2022-08-04 ENCOUNTER — Telehealth: Payer: Self-pay

## 2022-08-04 DIAGNOSIS — E785 Hyperlipidemia, unspecified: Secondary | ICD-10-CM

## 2022-08-04 MED ORDER — ROSUVASTATIN CALCIUM 20 MG PO TABS: 20.0000 mg | ORAL_TABLET | Freq: Every day | ORAL | 3 refills | Status: DC

## 2022-08-04 NOTE — Telephone Encounter (Signed)
Spoke with patient and discussed results of calcium score.  Per Dr. Tamala Julian: Let the patient know she has mild plaque build-up but no significant obstruction and needs preventive therapy. Start ASA 81 mg daily, Rosuvastatin 20 mg daily. Needs liver and lipid in 6 weeks.   Rosuvastatin 20mg  QD sent to [facility]. Patient is already taking ASA 81mg  QD and will continue.  Liver and Lipid panel ordered, lab appt scheduled for 09/15/22.  Patient verbalized understanding of the above and expressed appreciation for call.

## 2022-08-04 NOTE — Telephone Encounter (Signed)
Patient returned RN's call regarding results. 

## 2022-08-04 NOTE — Telephone Encounter (Signed)
-----   Message from Belva Crome, MD sent at 07/30/2022  6:38 PM EDT ----- Let the patient know she has mild plaque build-up but no significant obstruction and needs preventive therapy. Start ASA 81 mg daily, Rosuvastatin 20 mg daily. Needs liver and lipid in 6 weeks. A copy will be sent to Stacie Glaze, DO

## 2022-08-04 NOTE — Telephone Encounter (Signed)
Spoke with patient, see other phone note. °

## 2022-08-08 NOTE — Progress Notes (Signed)
Cardiology Office Note:    Date:  08/10/2022   ID:  Kerby Moors, DOB July 06, 1956, MRN [id number]  PCP:  Charlane Ferretti, MD  Cardiologist:  Sinclair Grooms, MD   Referring MD: Charlane Ferretti, MD   Chief Complaint  Patient presents with   Coronary Artery Disease   Hypertension   Hyperlipidemia   Advice Only    Elevated A1c    History of Present Illness:    Katie Jefferson is a 66 y.o. female with a hx of  primary hypertension, DM II, chest tightness and DOE, and elevated calcium score of 22 with nonobstructive coronary disease by CTA..  Follow-up after calcium score demonstrated evidence of asymptomatic CAD.  No recurrence of chest pain.  We had discussion concerning her 3 major risk factors that include diabetes, hyperlipidemia, and hypertension.  We discussed targets.  Past Medical History:  Diagnosis Date   Allergic rhinitis    Carpal tunnel syndrome    Chest tightness    DDD (degenerative disc disease), lumbar    Depression    Dysphagia    Family history of early CAD    GERD (gastroesophageal reflux disease)    Headaches, cluster    Hematuria    Hematuria    Hypertension    Insomnia    Menopause    Obesity    Ovarian cyst    Pain in joint, ankle and foot    Renal calculus    Tendonitis, Achilles    Upper extremity pain     Past Surgical History:  Procedure Laterality Date   LAPAROSCOPIC TOTAL HYSTERECTOMY     REPLACEMENT TOTAL KNEE      Current Medications: Current Meds  Medication Sig   amLODipine (NORVASC) 2.5 MG tablet TAKE 1 TABLET BY MOUTH EVERY DAY   Ashwagandha 500 MG CAPS Take 2 capsules by mouth daily at 6 (six) AM.   aspirin EC 81 MG tablet Take 1 tablet (81 mg total) by mouth daily. Swallow whole.   B Complex Vitamins (VITAMIN B-COMPLEX) TABS Take 1 tablet by mouth daily at 6 (six) AM.   busPIRone (BUSPAR) 7.5 MG tablet Take 1 tablet by mouth twice a day as needed for anxiety   cetirizine (ALLERGY, CETIRIZINE,) 10 MG tablet TAKE 1 TABLET  BY MOUTH DAILY AS NEEDED   Cod Liver Oil CAPS Take 1 capsule by mouth daily at 6 (six) AM.   Coenzyme Q10 (CO Q-10) 400 MG CAPS Take 1 capsule by mouth daily at 6 (six) AM.   fluticasone (FLONASE ALLERGY RELIEF) 50 MCG/ACT nasal spray 1 spray as needed.   levothyroxine (SYNTHROID) 75 MCG tablet TAKE 1 TABLET BY MOUTH EVERY DAY IN THE MORNING ON AN EMPTY STOMACH   omeprazole (PRILOSEC) 20 MG capsule TAKE 1 CAPSULE BY MOUTH DAILY   rosuvastatin (CRESTOR) 20 MG tablet Take 1 tablet (20 mg total) by mouth daily.   TURMERIC PO Take 2 tablets by mouth daily at 6 (six) AM.   valsartan-hydrochlorothiazide (DIOVAN-HCT) 80-12.5 MG tablet 1 tablet Orally Once a day 90 days     Allergies:   Latex and Hydrocodone   Social History   Socioeconomic History   Marital status: Married    Spouse name: Not on file   Number of children: Not on file   Years of education: Not on file   Highest education level: Not on file  Occupational History   Not on file  Tobacco Use   Smoking status: Former    Types:  Cigarettes   Smokeless tobacco: Never  Substance and Sexual Activity   Alcohol use: Not on file   Drug use: Not on file   Sexual activity: Not on file  Other Topics Concern   Not on file  Social History Narrative   Not on file   Social Determinants of Health   Financial Resource Strain: Not on file  Food Insecurity: Not on file  Transportation Needs: Not on file  Physical Activity: Not on file  Stress: Not on file  Social Connections: Not on file     Family History: The patient's family history includes Breast cancer in her paternal aunt; CAD in her father; Cancer - Colon in her paternal grandmother; Diabetes in her brother, sister, sister, and sister; Heart Problems in her father; Heart failure in her sister, sister, and sister; Hyperlipidemia in her father and mother; Hypertension in her father, sister, and sister; Obesity in her mother; Osteoarthritis in her father.  ROS:   Please see  the history of present illness.    No complaints.  No recurrent chest pain.  All other systems reviewed and are negative.  EKGs/Labs/Other Studies Reviewed:    The following studies were reviewed today: Coronary CTA 07/29/2022: IMPRESSION: 1. Coronary artery calcium score 21.7 Agatston units. This places the patient in the 65th percentile for age and gender, suggesting intermediate risk for future cardiac events.   2.  Mild nonobstructive CAD.   EKG:  EKG not repeated  Recent Labs: 06/09/2022: BUN 21; Creatinine, Ser 0.80; Potassium 4.0; Sodium 140  Recent Lipid Panel No results found for: "CHOL", "TRIG", "HDL", "CHOLHDL", "VLDL", "LDLCALC", "LDLDIRECT"  Physical Exam:    VS:  BP 112/72   Pulse 70   Ht 5' 5.5" (1.664 m)   Wt 201 lb (91.2 kg)   SpO2 96%   BMI 32.94 kg/m     Wt Readings from Last 3 Encounters:  08/10/22 201 lb (91.2 kg)  06/09/22 211 lb (95.7 kg)  05/16/21 220 lb (99.8 kg)     GEN: Slightly overweight. No acute distress HEENT: Normal NECK: No JVD. LYMPHATICS: No lymphadenopathy CARDIAC: No murmur. RRR no gallop, or edema.    ASSESSMENT:    1. Chest pain of uncertain etiology   2. Primary hypertension   3. Hyperlipidemia LDL goal <70   4. Controlled type 2 diabetes mellitus with other circulatory complication, without long-term current use of insulin (HCC)   5. Coronary artery disease involving native coronary artery of native heart without angina pectoris    PLAN:    In order of problems listed above:  Chest discomfort has not recurred.  No obstructive coronary disease.  If she continues to have episodes of chest discomfort, microvascular coronary disease should be considered.  See below. Target 130/80 mmHg.  Continue amlodipine and ACE/HCTZ. Moderate intensity statin therapy has been started.  We will treat to target given her risk factor profile. If ever develops shortness of breath or evidence of diastolic dysfunction, consider adding  SGLT2. She has asymptomatic CAD.  If she continues to have angina, consideration would be microvascular angina.  For now aggressive risk factor modification.  If she has microvascular dysfunction, statin therapy and angiotensin renin system blockade other first management strategies.   Overall education and awareness concerning secondary risk prevention was discussed in detail: LDL less than 70, hemoglobin A1c less than 7, blood pressure target less than 130/80 mmHg, >150 minutes of moderate aerobic activity per week, avoidance of smoking, weight control (via diet and  exercise), and continued surveillance/management of/for obstructive sleep apnea.  As needed cardiology follow-up if recurring episodes of chest pain.  At that point a CMD study could be performed invasively or PET perfusion study could be done to calculate CFR.  Medication Adjustments/Labs and Tests Ordered: Current medicines are reviewed at length with the patient today.  Concerns regarding medicines are outlined above.  No orders of the defined types were placed in this encounter.  No orders of the defined types were placed in this encounter.   Patient Instructions  Medication Instructions:  Your physician recommends that you continue on your current medications as directed. Please refer to the Current Medication list given to you today.  *If you need a refill on your cardiac medications before your next appointment, please call your pharmacy*  Lab Work: NONE  Testing/Procedures: NONE   Important Information About Sugar         Signed, Lesleigh Noe, MD  08/10/2022 10:55 AM    [facility]

## 2022-08-10 ENCOUNTER — Ambulatory Visit
Payer: No Typology Code available for payment source | Attending: Interventional Cardiology | Admitting: Interventional Cardiology

## 2022-08-10 ENCOUNTER — Encounter: Payer: Self-pay | Admitting: Interventional Cardiology

## 2022-08-10 VITALS — BP 112/72 | HR 70 | Ht 65.5 in | Wt 201.0 lb

## 2022-08-10 DIAGNOSIS — R079 Chest pain, unspecified: Secondary | ICD-10-CM

## 2022-08-10 DIAGNOSIS — E785 Hyperlipidemia, unspecified: Secondary | ICD-10-CM | POA: Diagnosis not present

## 2022-08-10 DIAGNOSIS — E1159 Type 2 diabetes mellitus with other circulatory complications: Secondary | ICD-10-CM

## 2022-08-10 DIAGNOSIS — I2510 Atherosclerotic heart disease of native coronary artery without angina pectoris: Secondary | ICD-10-CM

## 2022-08-10 DIAGNOSIS — I10 Essential (primary) hypertension: Secondary | ICD-10-CM | POA: Diagnosis not present

## 2022-08-10 NOTE — Patient Instructions (Signed)
Medication Instructions:  Your physician recommends that you continue on your current medications as directed. Please refer to the Current Medication list given to you today.  *If you need a refill on your cardiac medications before your next appointment, please call your pharmacy*  Lab Work: NONE  Testing/Procedures: NONE   Important Information About Sugar       

## 2022-09-01 ENCOUNTER — Ambulatory Visit
Admission: RE | Admit: 2022-09-01 | Discharge: 2022-09-01 | Disposition: A | Discharge status: Home / Self Care | Payer: No Typology Code available for payment source | Source: Ambulatory Visit | Attending: Internal Medicine | Admitting: Internal Medicine

## 2022-09-01 DIAGNOSIS — Z1231 Encounter for screening mammogram for malignant neoplasm of breast: Secondary | ICD-10-CM

## 2022-09-15 ENCOUNTER — Ambulatory Visit: Payer: No Typology Code available for payment source | Attending: Interventional Cardiology

## 2022-09-15 DIAGNOSIS — E785 Hyperlipidemia, unspecified: Secondary | ICD-10-CM

## 2022-09-16 LAB — LIPID PANEL
Chol/HDL Ratio: 2.4 ratio (ref 0.0–4.4)
Cholesterol, Total: 125 mg/dL (ref 100–199)
HDL: 52 mg/dL (ref >39)
LDL Chol Calc (NIH): 61 mg/dL (ref 0–99)
Triglycerides: 55 mg/dL (ref 0–149)
VLDL Cholesterol Cal: 12 mg/dL (ref 5–40)

## 2022-09-16 LAB — HEPATIC FUNCTION PANEL
ALT: 15 IU/L (ref 0–32)
AST: 20 IU/L (ref 0–40)
Albumin: 4.7 g/dL (ref 3.9–4.9)
Alkaline Phosphatase: 106 IU/L (ref 44–121)
Bilirubin Total: 0.3 mg/dL (ref 0.0–1.2)
Bilirubin, Direct: 0.11 mg/dL (ref 0.00–0.40)
Total Protein: 6.9 g/dL (ref 6.0–8.5)

## 2022-12-20 ENCOUNTER — Other Ambulatory Visit: Payer: Self-pay | Admitting: Obstetrics and Gynecology

## 2022-12-20 DIAGNOSIS — Z780 Asymptomatic menopausal state: Secondary | ICD-10-CM

## 2023-01-20 ENCOUNTER — Encounter (HOSPITAL_BASED_OUTPATIENT_CLINIC_OR_DEPARTMENT_OTHER): Payer: Self-pay | Admitting: Emergency Medicine

## 2023-01-20 ENCOUNTER — Emergency Department (HOSPITAL_BASED_OUTPATIENT_CLINIC_OR_DEPARTMENT_OTHER): Payer: No Typology Code available for payment source

## 2023-01-20 ENCOUNTER — Other Ambulatory Visit: Payer: Self-pay

## 2023-01-20 ENCOUNTER — Emergency Department (HOSPITAL_BASED_OUTPATIENT_CLINIC_OR_DEPARTMENT_OTHER)
Admission: EM | Admit: 2023-01-20 | Discharge: 2023-01-21 | Disposition: A | Discharge status: Home / Self Care | Payer: No Typology Code available for payment source | Attending: Emergency Medicine | Admitting: Emergency Medicine

## 2023-01-20 DIAGNOSIS — Z91040 Latex allergy status: Secondary | ICD-10-CM | POA: Insufficient documentation

## 2023-01-20 DIAGNOSIS — K219 Gastro-esophageal reflux disease without esophagitis: Secondary | ICD-10-CM

## 2023-01-20 DIAGNOSIS — E876 Hypokalemia: Secondary | ICD-10-CM | POA: Diagnosis not present

## 2023-01-20 DIAGNOSIS — K5900 Constipation, unspecified: Secondary | ICD-10-CM | POA: Insufficient documentation

## 2023-01-20 DIAGNOSIS — Z7982 Long term (current) use of aspirin: Secondary | ICD-10-CM | POA: Diagnosis not present

## 2023-01-20 DIAGNOSIS — R1013 Epigastric pain: Secondary | ICD-10-CM | POA: Diagnosis present

## 2023-01-20 LAB — COMPREHENSIVE METABOLIC PANEL
ALT: 16 U/L (ref 0–44)
AST: 20 U/L (ref 15–41)
Albumin: 4.1 g/dL (ref 3.5–5.0)
Alkaline Phosphatase: 86 U/L (ref 38–126)
Anion gap: 10 (ref 5–15)
BUN: 20 mg/dL (ref 8–23)
CO2: 28 mmol/L (ref 22–32)
Calcium: 9.2 mg/dL (ref 8.9–10.3)
Chloride: 99 mmol/L (ref 98–111)
Creatinine, Ser: 0.69 mg/dL (ref 0.44–1.00)
GFR, Estimated: >60 mL/min (ref >60)
Glucose, Bld: 89 mg/dL (ref 70–99)
Potassium: 3.2 mmol/L — ABNORMAL LOW (ref 3.5–5.1)
Sodium: 137 mmol/L (ref 135–145)
Total Bilirubin: 0.4 mg/dL (ref 0.3–1.2)
Total Protein: 7.8 g/dL (ref 6.5–8.1)

## 2023-01-20 LAB — CBC
HCT: 38.5 % (ref 36.0–46.0)
Hemoglobin: 12.7 g/dL (ref 12.0–15.0)
MCH: 29.3 pg (ref 26.0–34.0)
MCHC: 33 g/dL (ref 30.0–36.0)
MCV: 88.9 fL (ref 80.0–100.0)
Platelets: 243 10*3/uL (ref 150–400)
RBC: 4.33 MIL/uL (ref 3.87–5.11)
RDW: 14.1 % (ref 11.5–15.5)
WBC: 7.5 10*3/uL (ref 4.0–10.5)
nRBC: 0 % (ref 0.0–0.2)

## 2023-01-20 LAB — URINALYSIS, ROUTINE W REFLEX MICROSCOPIC
Bilirubin Urine: NEGATIVE
Glucose, UA: NEGATIVE mg/dL
Ketones, ur: NEGATIVE mg/dL
Leukocytes,Ua: NEGATIVE
Nitrite: NEGATIVE
Protein, ur: NEGATIVE mg/dL
Specific Gravity, Urine: <=1.005 (ref 1.005–1.030)
pH: 5.5 (ref 5.0–8.0)

## 2023-01-20 LAB — URINALYSIS, MICROSCOPIC (REFLEX): WBC, UA: NONE SEEN WBC/hpf (ref 0–5)

## 2023-01-20 LAB — LIPASE, BLOOD: Lipase: 47 U/L (ref 11–51)

## 2023-01-20 MED ORDER — ALUM & MAG HYDROXIDE-SIMETH 200-200-20 MG/5ML PO SUSP
30.0000 mL | Freq: Once | ORAL | Status: AC
  Administered 2023-01-20: 30 mL via ORAL
  Filled 2023-01-20: qty 30

## 2023-01-20 MED ORDER — ONDANSETRON HCL 4 MG/2ML IJ SOLN
4.0000 mg | Freq: Once | INTRAMUSCULAR | Status: AC
  Administered 2023-01-20: 4 mg via INTRAVENOUS
  Filled 2023-01-20: qty 2

## 2023-01-20 NOTE — ED Triage Notes (Signed)
Patient with abdominal pain that started Monday afternoon.  No nausea or vomiting at this time.  She spoke with PCP and told her to take some omeprazole and zantac, which have not helped.  She states she started on Metformin about one month ago.

## 2023-01-21 DIAGNOSIS — K5900 Constipation, unspecified: Secondary | ICD-10-CM | POA: Diagnosis not present

## 2023-01-21 MED ORDER — KETOROLAC TROMETHAMINE 30 MG/ML IJ SOLN: 15.0000 mg | Freq: Once | INTRAMUSCULAR | Status: DC

## 2023-01-21 MED ORDER — LIDOCAINE 5 % EX PTCH: 1.0000 | MEDICATED_PATCH | CUTANEOUS | 0 refills | Status: DC

## 2023-01-21 MED ORDER — OMEPRAZOLE 20 MG PO CPDR: 20.0000 mg | DELAYED_RELEASE_CAPSULE | Freq: Every day | ORAL | 0 refills | Status: AC

## 2023-01-21 MED ORDER — LIDOCAINE 5 % EX PTCH: 3.0000 | MEDICATED_PATCH | CUTANEOUS | Status: DC

## 2023-01-21 MED ORDER — LACTULOSE 10 G PO PACK: 10.0000 g | PACK | Freq: Two times a day (BID) | ORAL | 0 refills | Status: DC

## 2023-01-21 MED ORDER — MELOXICAM 7.5 MG PO TABS: 7.5000 mg | ORAL_TABLET | Freq: Every day | ORAL | 0 refills | Status: DC

## 2023-01-21 MED ORDER — METAXALONE 800 MG PO TABS: 800.0000 mg | ORAL_TABLET | Freq: Three times a day (TID) | ORAL | 0 refills | Status: DC

## 2023-01-21 MED ORDER — IOHEXOL 300 MG/ML  SOLN
100.0000 mL | Freq: Once | INTRAMUSCULAR | Status: AC | PRN
  Administered 2023-01-21: 100 mL via INTRAVENOUS

## 2023-01-21 NOTE — ED Provider Notes (Signed)
[facility] [facility] Provider Note   CSN: [id number] Arrival date & time: 01/20/23  2218     History  Chief Complaint  Patient presents with   Abdominal Pain    Katie Jefferson is a 67 y.o. female.  The history is provided by the patient.  Abdominal Pain Pain location:  Epigastric and L flank Pain quality: aching   Pain radiates to:  Does not radiate Pain severity:  Moderate Onset quality:  Gradual Duration:  1 week Timing:  Constant Progression:  Unchanged Chronicity:  New Context: eating   Relieved by:  Nothing Worsened by:  Nothing Ineffective treatments:  None tried Associated symptoms: no chest pain, no cough, no diarrhea, no fever, no nausea, no shortness of breath and no vomiting   Epigastric and LUQ pain x 1 weeks.  No N/v/d.  No fevers.  No urinary complaints.         Home Medications Prior to Admission medications   Medication Sig Start Date End Date Taking? Authorizing Provider  lactulose (CEPHULAC) 10 g packet Take 1 packet (10 g total) by mouth 2 (two) times daily. 01/21/23  Yes Slater Mcmanaman, MD  amLODipine (NORVASC) 2.5 MG tablet TAKE 1 TABLET BY MOUTH EVERY DAY 05/11/22     Ashwagandha 500 MG CAPS Take 2 capsules by mouth daily at 6 (six) AM.    [provider]  aspirin EC 81 MG tablet Take 1 tablet (81 mg total) by mouth daily. Swallow whole. 06/09/22   Belva Crome, MD  B Complex Vitamins (VITAMIN B-COMPLEX) TABS Take 1 tablet by mouth daily at 6 (six) AM.    [provider]  busPIRone (BUSPAR) 7.5 MG tablet Take 1 tablet by mouth twice a day as needed for anxiety 11/05/21     cetirizine (ALLERGY, CETIRIZINE,) 10 MG tablet TAKE 1 TABLET BY MOUTH DAILY AS NEEDED 10/12/21     Cod Liver Oil CAPS Take 1 capsule by mouth daily at 6 (six) AM.    [provider]  Coenzyme Q10 (CO Q-10) 400 MG CAPS Take 1 capsule by mouth daily at 6 (six) AM.    [provider]  fluticasone (FLONASE  ALLERGY RELIEF) 50 MCG/ACT nasal spray 1 spray as needed.    [provider]  levothyroxine (SYNTHROID) 75 MCG tablet TAKE 1 TABLET BY MOUTH EVERY DAY IN THE MORNING ON AN EMPTY STOMACH 03/02/22     omeprazole (PRILOSEC) 20 MG capsule TAKE 1 CAPSULE BY MOUTH DAILY 10/12/21     rosuvastatin (CRESTOR) 20 MG tablet Take 1 tablet (20 mg total) by mouth daily. 08/04/22   Belva Crome, MD  TURMERIC PO Take 2 tablets by mouth daily at 6 (six) AM.    [provider]  valsartan-hydrochlorothiazide (DIOVAN-HCT) 80-12.5 MG tablet 1 tablet Orally Once a day 90 days 07/02/21         Allergies    Latex and Hydrocodone    Review of Systems   Review of Systems  Constitutional:  Negative for fever.  HENT:  Negative for facial swelling.   Respiratory:  Negative for cough and shortness of breath.   Cardiovascular:  Negative for chest pain.  Gastrointestinal:  Positive for abdominal pain. Negative for diarrhea, nausea and vomiting.  All other systems reviewed and are negative.   Physical Exam Updated Vital Signs BP 106/78   Pulse 62   Temp 98.1 F (36.7 C) (Oral)   Resp 18   Ht 5\' 4"  (1.626  m)   Wt 83.9 kg   SpO2 99%   BMI 31.76 kg/m  Physical Exam Vitals and nursing note reviewed.  Constitutional:      General: She is not in acute distress.    Appearance: Normal appearance. She is well-developed.  HENT:     Head: Normocephalic and atraumatic.     Nose: Nose normal.  Eyes:     Pupils: Pupils are equal, round, and reactive to light.  Cardiovascular:     Rate and Rhythm: Normal rate and regular rhythm.     Pulses: Normal pulses.     Heart sounds: Normal heart sounds.  Pulmonary:     Effort: Pulmonary effort is normal. No respiratory distress.     Breath sounds: Normal breath sounds.  Abdominal:     General: Bowel sounds are normal. There is no distension.     Palpations: Abdomen is soft.     Tenderness: There is no abdominal tenderness. There is no guarding or rebound.   Genitourinary:    Vagina: No vaginal discharge.  Musculoskeletal:        General: Normal range of motion.     Cervical back: Normal range of motion and neck supple.  Skin:    General: Skin is warm and dry.     Capillary Refill: Capillary refill takes less than 2 seconds.     Findings: No erythema or rash.  Neurological:     General: No focal deficit present.     Mental Status: She is alert and oriented to person, place, and time.     Deep Tendon Reflexes: Reflexes normal.  Psychiatric:        Mood and Affect: Mood normal.        Behavior: Behavior normal.     ED Results / Procedures / Treatments   Labs (all labs ordered are listed, but only abnormal results are displayed) Results for orders placed or performed during the hospital encounter of 01/20/23  Lipase, blood  Result Value Ref Range   Lipase 47 11 - 51 U/L  Comprehensive metabolic panel  Result Value Ref Range   Sodium 137 135 - 145 mmol/L   Potassium 3.2 (L) 3.5 - 5.1 mmol/L   Chloride 99 98 - 111 mmol/L   CO2 28 22 - 32 mmol/L   Glucose, Bld 89 70 - 99 mg/dL   BUN 20 8 - 23 mg/dL   Creatinine, Ser 0.69 0.44 - 1.00 mg/dL   Calcium 9.2 8.9 - 10.3 mg/dL   Total Protein 7.8 6.5 - 8.1 g/dL   Albumin 4.1 3.5 - 5.0 g/dL   AST 20 15 - 41 U/L   ALT 16 0 - 44 U/L   Alkaline Phosphatase 86 38 - 126 U/L   Total Bilirubin 0.4 0.3 - 1.2 mg/dL   GFR, Estimated >60 >60 mL/min   Anion gap 10 5 - 15  CBC  Result Value Ref Range   WBC 7.5 4.0 - 10.5 K/uL   RBC 4.33 3.87 - 5.11 MIL/uL   Hemoglobin 12.7 12.0 - 15.0 g/dL   HCT 38.5 36.0 - 46.0 %   MCV 88.9 80.0 - 100.0 fL   MCH 29.3 26.0 - 34.0 pg   MCHC 33.0 30.0 - 36.0 g/dL   RDW 14.1 11.5 - 15.5 %   Platelets 243 150 - 400 K/uL   nRBC 0.0 0.0 - 0.2 %  Urinalysis, Routine w reflex microscopic -Urine, Clean Catch  Result Value Ref Range   Color, Urine YELLOW  YELLOW   APPearance CLEAR CLEAR   Specific Gravity, Urine <=1.005 1.005 - 1.030   pH 5.5 5.0 - 8.0    Glucose, UA NEGATIVE NEGATIVE mg/dL   Hgb urine dipstick MODERATE (A) NEGATIVE   Bilirubin Urine NEGATIVE NEGATIVE   Ketones, ur NEGATIVE NEGATIVE mg/dL   Protein, ur NEGATIVE NEGATIVE mg/dL   Nitrite NEGATIVE NEGATIVE   Leukocytes,Ua NEGATIVE NEGATIVE  Urinalysis, Microscopic (reflex)  Result Value Ref Range   RBC / HPF 6-10 0 - 5 RBC/hpf   WBC, UA NONE SEEN 0 - 5 WBC/hpf   Bacteria, UA FEW (A) NONE SEEN   Squamous Epithelial / HPF 6-10 0 - 5 /HPF   CT ABDOMEN PELVIS W CONTRAST  Result Date: 01/21/2023 CLINICAL DATA:  Abdominal pain. EXAM: CT ABDOMEN AND PELVIS WITH CONTRAST TECHNIQUE: Multidetector CT imaging of the abdomen and pelvis was performed using the standard protocol following bolus administration of intravenous contrast. RADIATION DOSE REDUCTION: This exam was performed according to the departmental dose-optimization program which includes automated exposure control, adjustment of the mA and/or kV according to patient size and/or use of iterative reconstruction technique. CONTRAST:  125mL OMNIPAQUE IOHEXOL 300 MG/ML  SOLN COMPARISON:  08/11/1999. FINDINGS: Lower chest: Atelectasis is present at the lung bases. Hepatobiliary: Hypodensities are present in the liver, the largest measuring 3.7 cm is cystic in attenuation. No biliary ductal dilatation. The gallbladder is unremarkable. Pancreas: Unremarkable. No pancreatic ductal dilatation or surrounding inflammatory changes. Spleen: Normal in size without focal abnormality. Adrenals/Urinary Tract: The adrenal glands are within normal limits. The kidneys enhance symmetrically. There are subcentimeter hypodensities in the kidneys which are too small to further characterize. No renal calculus or hydronephrosis. The bladder is unremarkable. Stomach/Bowel: Stomach is within normal limits. Appendix appears normal. No evidence of bowel wall thickening, distention, or inflammatory changes. No free air or pneumatosis. A moderate amount of retained  stool is present in the colon. Vascular/Lymphatic: Aortic atherosclerosis. No enlarged abdominal or pelvic lymph nodes. Reproductive: The uterus is surgically absent. There is a cystic structure in the right adnexa measuring 2.6 cm. No follow-up imaging is recommended. Other: No abdominopelvic ascites. Musculoskeletal: Degenerative changes are present in the thoracolumbar spine. A sclerotic lesion is noted in the L2 vertebral body, most likely representing a bone island. No acute osseous abnormality. IMPRESSION: 1. No acute intra-abdominal process. 2. Moderate amount of retained stool in the colon suggesting constipation. 3. Aortic atherosclerosis. Electronically Signed   By: Brett Fairy M.D.   On: 01/21/2023 00:34      Radiology CT ABDOMEN PELVIS W CONTRAST  Result Date: 01/21/2023 CLINICAL DATA:  Abdominal pain. EXAM: CT ABDOMEN AND PELVIS WITH CONTRAST TECHNIQUE: Multidetector CT imaging of the abdomen and pelvis was performed using the standard protocol following bolus administration of intravenous contrast. RADIATION DOSE REDUCTION: This exam was performed according to the departmental dose-optimization program which includes automated exposure control, adjustment of the mA and/or kV according to patient size and/or use of iterative reconstruction technique. CONTRAST:  12mL OMNIPAQUE IOHEXOL 300 MG/ML  SOLN COMPARISON:  08/11/1999. FINDINGS: Lower chest: Atelectasis is present at the lung bases. Hepatobiliary: Hypodensities are present in the liver, the largest measuring 3.7 cm is cystic in attenuation. No biliary ductal dilatation. The gallbladder is unremarkable. Pancreas: Unremarkable. No pancreatic ductal dilatation or surrounding inflammatory changes. Spleen: Normal in size without focal abnormality. Adrenals/Urinary Tract: The adrenal glands are within normal limits. The kidneys enhance symmetrically. There are subcentimeter hypodensities in the kidneys which are too small to further  characterize. No renal calculus or hydronephrosis. The bladder is unremarkable. Stomach/Bowel: Stomach is within normal limits. Appendix appears normal. No evidence of bowel wall thickening, distention, or inflammatory changes. No free air or pneumatosis. A moderate amount of retained stool is present in the colon. Vascular/Lymphatic: Aortic atherosclerosis. No enlarged abdominal or pelvic lymph nodes. Reproductive: The uterus is surgically absent. There is a cystic structure in the right adnexa measuring 2.6 cm. No follow-up imaging is recommended. Other: No abdominopelvic ascites. Musculoskeletal: Degenerative changes are present in the thoracolumbar spine. A sclerotic lesion is noted in the L2 vertebral body, most likely representing a bone island. No acute osseous abnormality. IMPRESSION: 1. No acute intra-abdominal process. 2. Moderate amount of retained stool in the colon suggesting constipation. 3. Aortic atherosclerosis. Electronically Signed   By: Brett Fairy M.D.   On: 01/21/2023 00:34    Procedures Procedures    Medications Ordered in ED Medications  ondansetron [facility]) injection 4 mg (4 mg Intravenous Given 01/20/23 2339)  alum & mag hydroxide-simeth (MAALOX/MYLANTA) 200-200-20 MG/5ML suspension 30 mL (30 mLs Oral Given 01/20/23 2339)  iohexol (OMNIPAQUE) 300 MG/ML solution 100 mL (100 mLs Intravenous Contrast Given 01/21/23 0007)    ED Course/ Medical Decision Making/ A&P                             Medical Decision Making Upper abdominal pain with belching and gas   Amount and/or Complexity of Data Reviewed Independent Historian: spouse    Details: See above  External Data Reviewed: notes.    Details: Previous notes reviewed  Labs: ordered.    Details: All labs reviewed: normal lipase 47.  Normal white count 7.5, normal hemoglobin 12.7, normal platelet count.  Normal sodium 137, potassium 3.2 slightly low, normal creatinine .69. Urine is not consistent with UTI.   Radiology:  ordered and independent interpretation performed.    Details: Constipation by me   Risk OTC drugs. Prescription drug management. Risk Details: No Murphy's sign.  Pain is LUQ, and belching.  Patient has been eating greasy and gas inducing foods.  I believe this is GERD and patient is off her GERD medication which I will restart.  Will start lactulose for constipation and have advised gerd friendly diet.  Stable for discharge strict return.      Final Clinical Impression(s) / ED Diagnoses Final diagnoses:  Constipation, unspecified constipation type   Return for intractable cough, coughing up blood, fevers > 100.4 unrelieved by medication, shortness of breath, intractable vomiting, chest pain, shortness of breath, weakness, numbness, changes in speech, facial asymmetry, abdominal pain, passing out, Inability to tolerate liquids or food, cough, altered mental status or any concerns. No signs of systemic illness or infection. The patient is nontoxic-appearing on exam and vital signs are within normal limits.  I have reviewed the triage vital signs and the nursing notes. Pertinent labs & imaging results that were available during my care of the patient were reviewed by me and considered in my medical decision making (see chart for details). After history, exam, and medical workup I feel the patient has been appropriately medically screened and is safe for discharge home. Pertinent diagnoses were discussed with the patient. Patient was given return precautions.    Rx / DC Orders ED Discharge Orders          Ordered    lactulose (CEPHULAC) 10 g packet  2 times daily        01/21/23  [place], Beatric Fulop, MD 01/21/23 [id number]

## 2023-05-19 ENCOUNTER — Ambulatory Visit
Admission: RE | Admit: 2023-05-19 | Discharge: 2023-05-19 | Disposition: A | Discharge status: Home / Self Care | Payer: No Typology Code available for payment source | Source: Ambulatory Visit | Attending: Obstetrics and Gynecology | Admitting: Obstetrics and Gynecology

## 2023-05-19 DIAGNOSIS — Z780 Asymptomatic menopausal state: Secondary | ICD-10-CM

## 2023-07-05 IMAGING — MG MM DIGITAL SCREENING BILAT W/ TOMO AND CAD
8 series · 8 of 24 positions shown · non-contrast
Comparison: Previous exam(s).

CLINICAL DATA: Screening.

EXAM:
DIGITAL SCREENING BILATERAL MAMMOGRAM WITH TOMOSYNTHESIS AND CAD
TECHNIQUE: Bilateral screening digital craniocaudal and mediolateral oblique
mammograms were obtained. Bilateral screening digital breast
tomosynthesis was performed. The images were evaluated with
computer-aided detection.

[R MLO synth-2D]
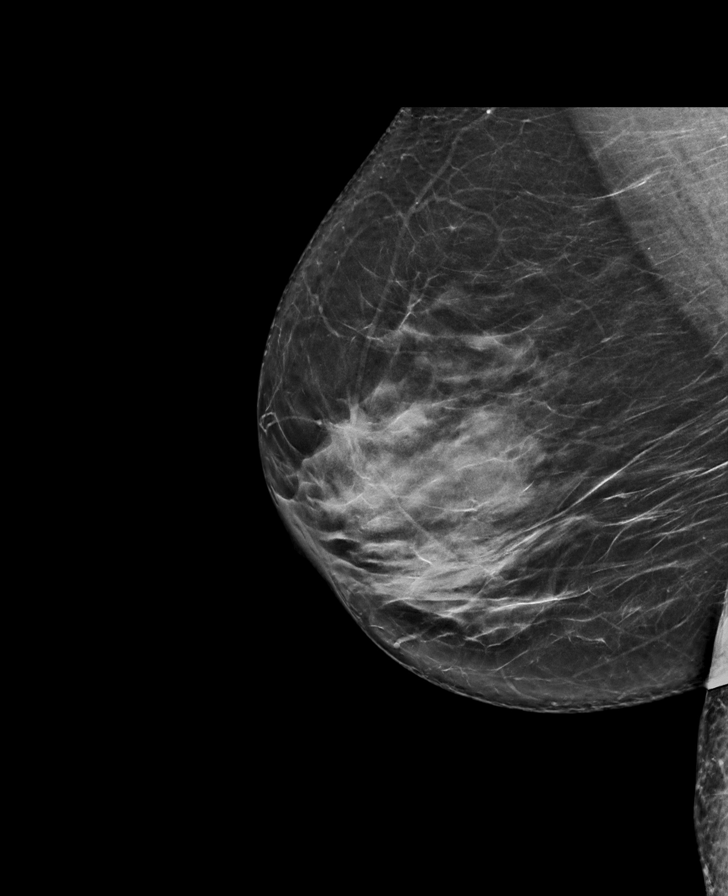

[L MLO synth-2D]
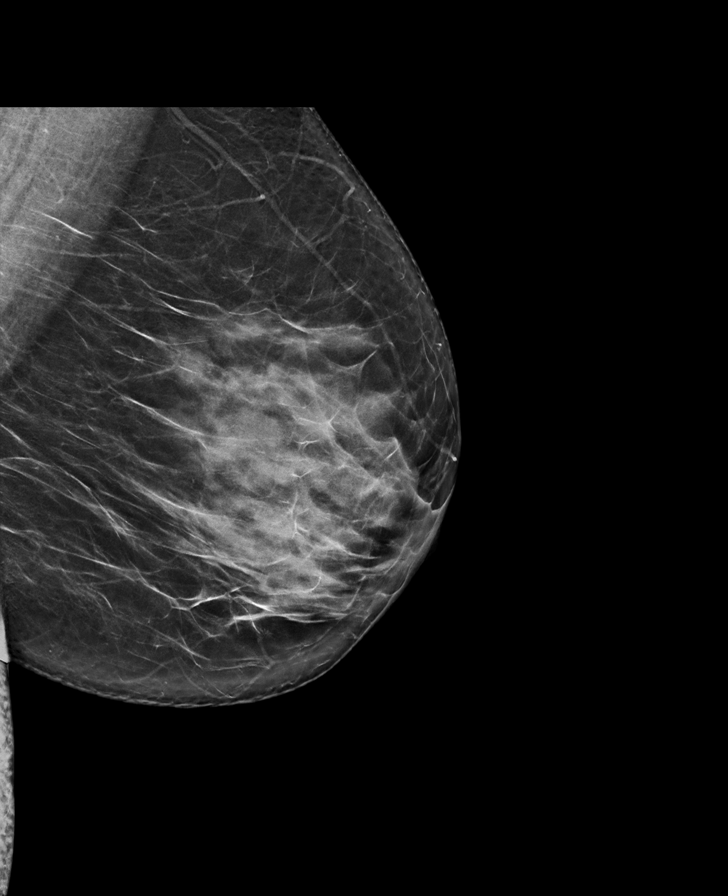

[R CC synth-2D]
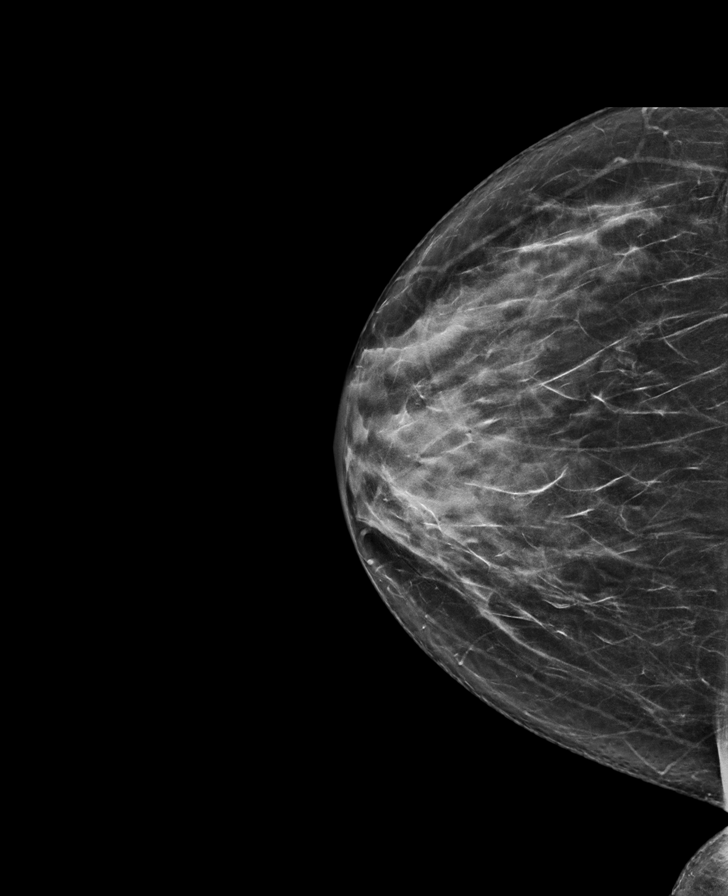

[L CC synth-2D]
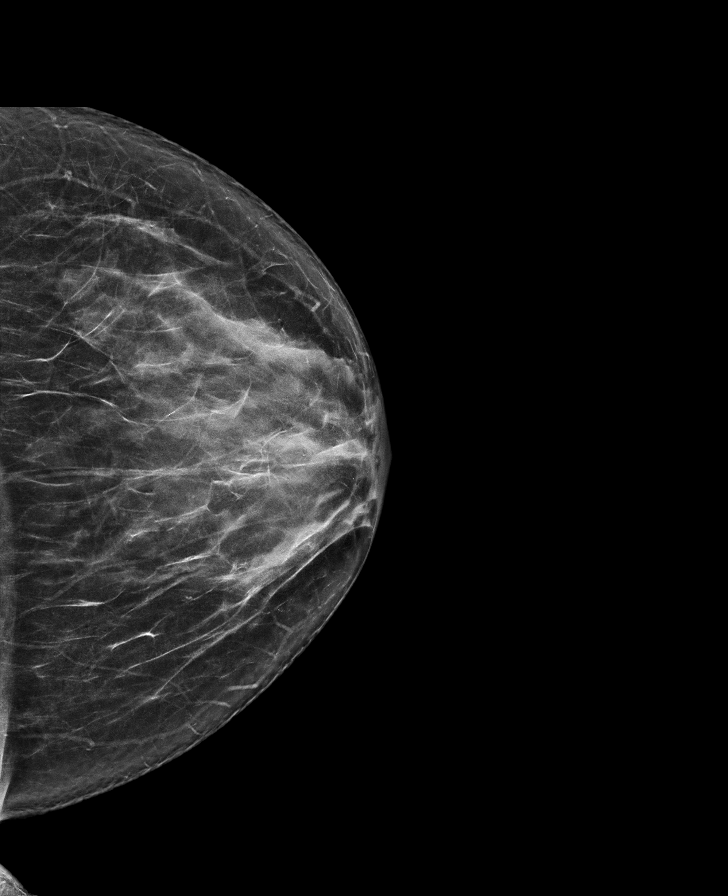

[R CC tomo · tomo slice 37/74.0]
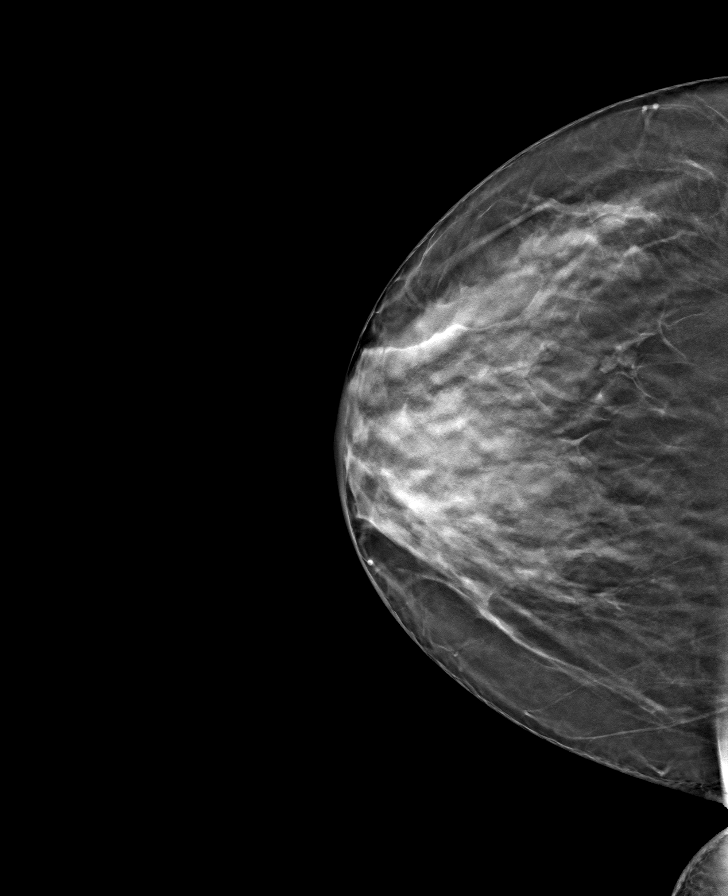

[L CC tomo · tomo slice 37/73.0]
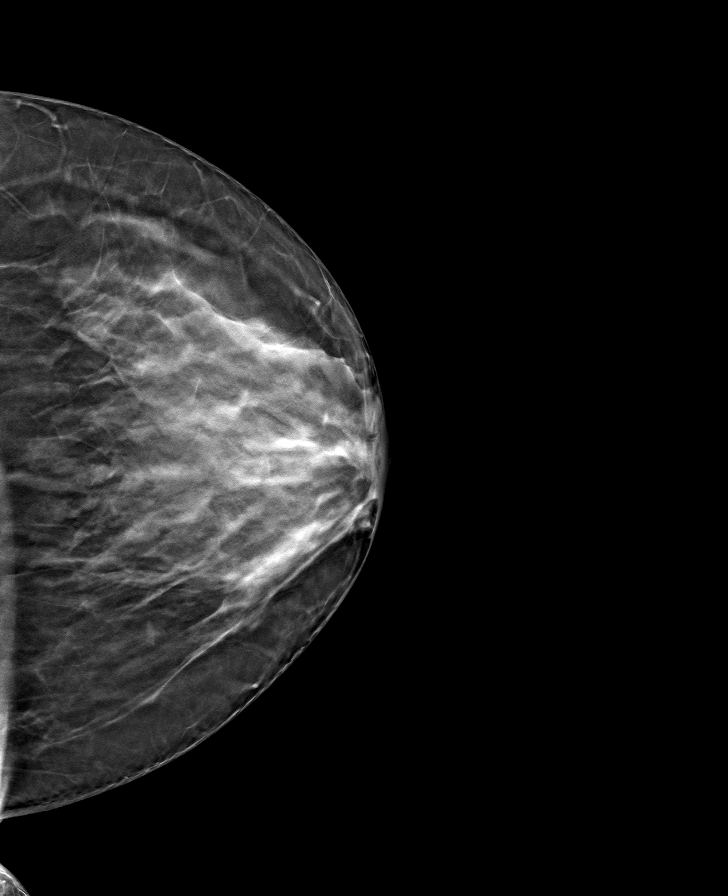

[R MLO tomo · tomo slice 41/80.0]
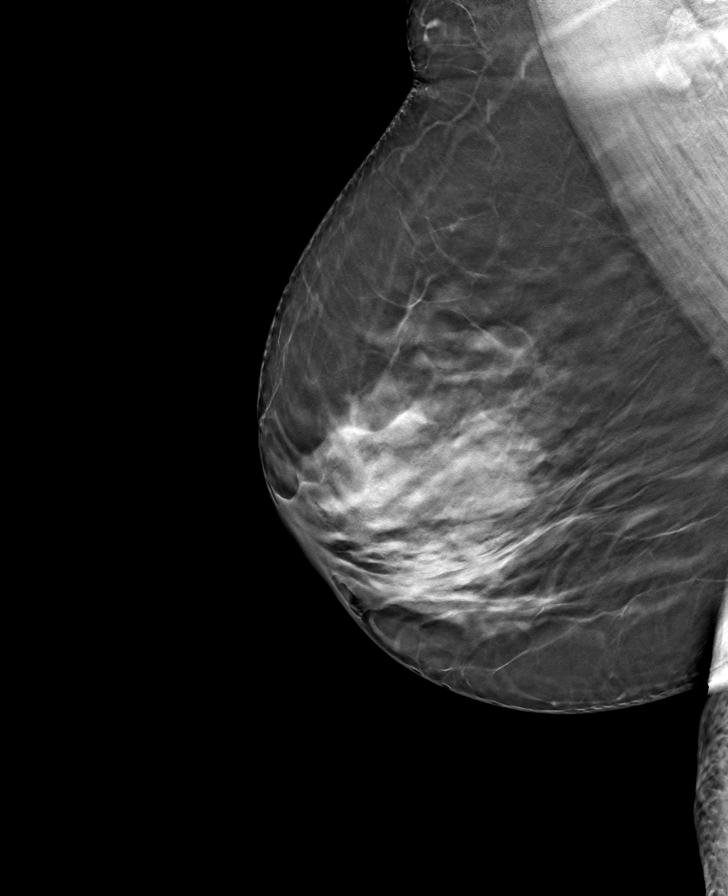

[L MLO tomo · tomo slice 41/81.0]
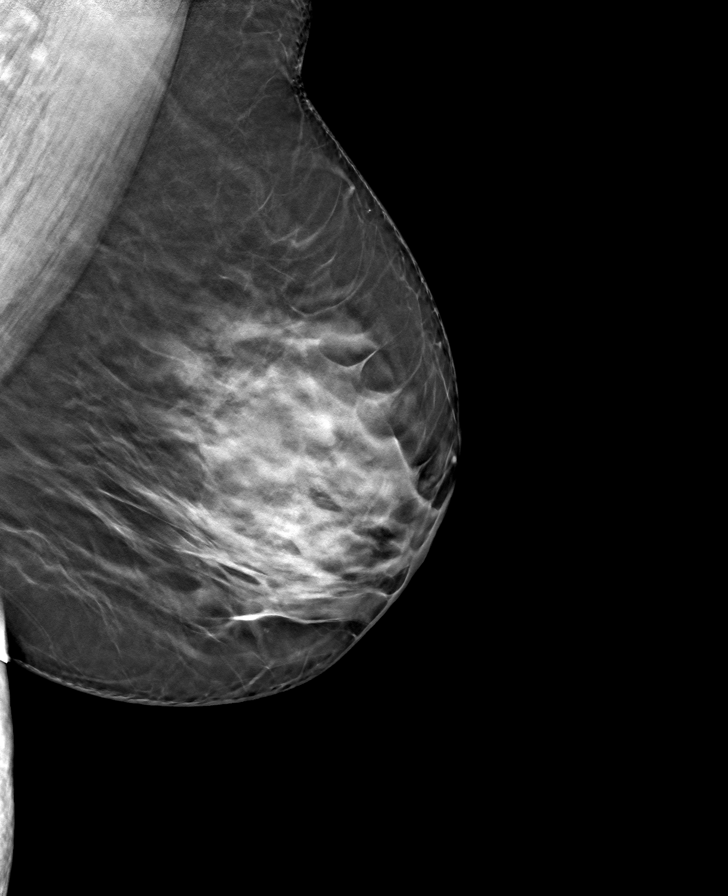

[8 of 24 positions shown; findings below may reference images not displayed]

ACR Breast Density Category c: The breast tissue is heterogeneously
dense, which may obscure small masses.
FINDINGS: There are no findings suspicious for malignancy.
IMPRESSION: No mammographic evidence of malignancy. A result letter of this
screening mammogram will be mailed directly to the patient.

RECOMMENDATION:
Screening mammogram in one year. (Code:Q3-W-BC3)

BI-RADS CATEGORY  1: Negative.

## 2023-07-08 ENCOUNTER — Other Ambulatory Visit: Payer: Self-pay

## 2023-07-08 MED ORDER — AMOXICILLIN 500 MG PO CAPS
ORAL_CAPSULE | ORAL | 2 refills | Status: DC
  Filled 2023-07-08: qty 4, 1d supply, fill #0
  Filled 2023-08-17: qty 4, 1d supply, fill #1
  Filled 2024-01-04: qty 4, 1d supply, fill #2

## 2023-07-29 ENCOUNTER — Other Ambulatory Visit: Payer: Self-pay | Admitting: Internal Medicine

## 2023-07-29 ENCOUNTER — Other Ambulatory Visit: Payer: Self-pay

## 2023-07-29 DIAGNOSIS — Z1231 Encounter for screening mammogram for malignant neoplasm of breast: Secondary | ICD-10-CM

## 2023-07-29 MED ORDER — ROSUVASTATIN CALCIUM 20 MG PO TABS: 20.0000 mg | ORAL_TABLET | Freq: Every day | ORAL | 0 refills | Status: AC

## 2023-08-17 ENCOUNTER — Other Ambulatory Visit: Payer: Self-pay

## 2023-09-05 ENCOUNTER — Ambulatory Visit
Admission: RE | Admit: 2023-09-05 | Discharge: 2023-09-05 | Disposition: A | Discharge status: Home / Self Care | Payer: 59 | Source: Ambulatory Visit | Attending: Internal Medicine | Admitting: Internal Medicine

## 2023-09-05 DIAGNOSIS — Z1231 Encounter for screening mammogram for malignant neoplasm of breast: Secondary | ICD-10-CM

## 2023-11-02 DIAGNOSIS — S46012D Strain of muscle(s) and tendon(s) of the rotator cuff of left shoulder, subsequent encounter: Secondary | ICD-10-CM | POA: Insufficient documentation

## 2023-11-02 DIAGNOSIS — M19012 Primary osteoarthritis, left shoulder: Secondary | ICD-10-CM | POA: Insufficient documentation

## 2023-11-02 HISTORY — DX: Primary osteoarthritis, left shoulder: M19.012

## 2023-11-02 HISTORY — DX: Strain of muscle(s) and tendon(s) of the rotator cuff of left shoulder, subsequent encounter: S46.012D

## 2024-01-04 ENCOUNTER — Other Ambulatory Visit (HOSPITAL_BASED_OUTPATIENT_CLINIC_OR_DEPARTMENT_OTHER): Payer: Self-pay

## 2024-02-01 DIAGNOSIS — G8929 Other chronic pain: Secondary | ICD-10-CM | POA: Insufficient documentation

## 2024-02-01 DIAGNOSIS — M1712 Unilateral primary osteoarthritis, left knee: Secondary | ICD-10-CM | POA: Insufficient documentation

## 2024-02-01 DIAGNOSIS — Z96651 Presence of right artificial knee joint: Secondary | ICD-10-CM

## 2024-02-01 HISTORY — DX: Unilateral primary osteoarthritis, left knee: M17.12

## 2024-02-01 HISTORY — DX: Other chronic pain: G89.29

## 2024-02-01 HISTORY — DX: Presence of right artificial knee joint: Z96.651

## 2024-02-17 ENCOUNTER — Encounter: Payer: Self-pay | Admitting: Obstetrics & Gynecology

## 2024-02-22 ENCOUNTER — Ambulatory Visit: Admitting: Obstetrics and Gynecology

## 2024-02-22 ENCOUNTER — Other Ambulatory Visit: Payer: Self-pay

## 2024-02-22 ENCOUNTER — Encounter: Payer: Self-pay | Admitting: Obstetrics and Gynecology

## 2024-02-22 VITALS — BP 124/81 | HR 69

## 2024-02-22 DIAGNOSIS — Z1331 Encounter for screening for depression: Secondary | ICD-10-CM | POA: Diagnosis not present

## 2024-02-22 DIAGNOSIS — N83201 Unspecified ovarian cyst, right side: Secondary | ICD-10-CM

## 2024-02-22 NOTE — Progress Notes (Signed)
 NEW GYNECOLOGY PATIENT Patient name: Katie Jefferson MRN [id number]  Date of birth: [birth year] Chief Complaint:   No chief complaint on file.     History:  Katie Jefferson is a 68 y.o. No obstetric history on file. being seen today for ovarian cyst.  Discussed the use of AI scribe software for clinical note transcription with the patient, who gave verbal consent to proceed.  History of Present Illness Katie Jefferson is a 68 year old female with ovarian cysts who presents with pelvic pressure and fullness.  She has a history of ovarian cysts that have been monitored for several years. Initially, the cysts were stable, but during her last OB visit, it was noted that they had grown. She has been experiencing pelvic pressure and fullness for the past six months.  She underwent a partial hysterectomy in the past, which led to the initial discovery of the ovarian cysts during a routine check-up. The cysts were monitored annually with scans, which showed no change until recently. No abnormal discharge or bleeding. She is not currently sexually active, citing her husband's prostate issues and recent surgery as contributing factors.  She experiences chronic constipation but no diarrhea, new bloating, or changes in stool caliber. Her bowel movements vary between hard and soft.  She had a significant fall in November, resulting in shoulder injuries requiring surgery. She is awaiting scheduling for this procedure and is considering the timing of her ovarian surgery in relation to her shoulder surgery and upcoming family events.           Gynecologic History No LMP recorded. Patient has had a hysterectomy. Contraception: status post hysterectomy Last Pap: n/a Last Mammogram:  08/2023 [facility]  1 Last Colonoscopy:  per PCP  Obstetric History OB History  No obstetric history on file.    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   Hypertension    Insertional Achilles tendinopathy 04/05/2018   Insomnia    Menopause    Obesity    Ovarian cyst    Pain in joint, ankle and foot    Primary osteoarthritis of left knee 02/01/2024   Primary osteoarthritis of left shoulder 11/02/2023   Renal calculus    Tendonitis, Achilles    Traumatic rotator cuff tear, left, subsequent encounter 11/02/2023   Unilateral primary osteoarthritis, right knee 12/31/2019   Added automatically from request for surgery [id number]     Upper extremity pain     Past Surgical History:  Procedure Laterality Date   LAPAROSCOPIC TOTAL HYSTERECTOMY     REPLACEMENT TOTAL KNEE      Current Outpatient Medications on File Prior to Visit  Medication Sig Dispense Refill   amLODipine  (NORVASC ) 2.5 MG tablet TAKE 1 TABLET BY MOUTH EVERY DAY 90 tablet 3   amoxicillin  (AMOXIL ) 500 MG capsule Take 4 caps by mouth 30-60 minutes prior to dental procedure. 4 capsule 2   Ashwagandha 500 MG CAPS Take 2 capsules by mouth daily at 6 (six) AM.     aspirin  EC 81 MG tablet  Take 1 tablet (81 mg total) by mouth daily. Swallow whole.     B Complex Vitamins (VITAMIN B-COMPLEX) TABS Take 1 tablet by mouth daily at 6 (six) AM.     cetirizine  (ALLERGY, CETIRIZINE ,) 10 MG tablet TAKE 1 TABLET BY MOUTH DAILY AS NEEDED 100 tablet 3   Cod Liver Oil CAPS Take 1 capsule by mouth daily at 6 (six) AM.     fluticasone (FLONASE ALLERGY RELIEF) 50 MCG/ACT nasal spray 1 spray as needed.     levothyroxine  (SYNTHROID ) 75 MCG tablet TAKE 1 TABLET BY MOUTH EVERY DAY IN THE MORNING ON AN EMPTY STOMACH 90 tablet 3   omeprazole  (PRILOSEC) 20 MG capsule TAKE 1 CAPSULE BY  MOUTH DAILY 90 capsule 1   rosuvastatin  (CRESTOR ) 20 MG tablet Take 1 tablet (20 mg total) by mouth daily. 30 tablet 0   TURMERIC PO Take 2 tablets by mouth daily at 6 (six) AM.     valsartan -hydrochlorothiazide  (DIOVAN -HCT) 80-12.5 MG tablet 1 tablet Orally Once a day 90 days 90 tablet 3   busPIRone  (BUSPAR ) 7.5 MG tablet Take 1 tablet by mouth twice a day as needed for anxiety (Patient not taking: Reported on 02/22/2024) 60 tablet 2   Coenzyme Q10 (CO Q-10) 400 MG CAPS Take 1 capsule by mouth daily at 6 (six) AM. (Patient not taking: Reported on 02/22/2024)     omeprazole  (PRILOSEC) 20 MG capsule Take 1 capsule (20 mg total) by mouth daily. 30 capsule 0   No current facility-administered medications on file prior to visit.    Allergies  Allergen Reactions   Codeine Nausea And Vomiting   Latex Rash   Hydrocodone Nausea And Vomiting and Nausea Only   Metformin Other (See Comments)    Social History:  reports that she has quit smoking. Her smoking use included cigarettes. She has never used smokeless tobacco.  Family History  Problem Relation Age of Onset   Hyperlipidemia Mother    Obesity Mother    Hyperlipidemia Father    Hypertension Father    Osteoarthritis Father    Heart Problems Father    CAD Father    Diabetes Sister    Heart failure Sister    Hypertension Sister    Heart failure Sister    Diabetes Sister    Hypertension Sister    Heart failure Sister    Diabetes Sister    Diabetes Brother    Cancer - Colon Paternal Grandmother    Breast cancer Paternal Aunt     The following portions of the patient's history were reviewed and updated as appropriate: allergies, current medications, past family history, past medical history, past social history, past surgical history and problem list.  Review of Systems Pertinent items noted in HPI and remainder of comprehensive ROS otherwise negative.  Physical Exam:  BP 124/81   Pulse 69  Physical Exam Vitals and nursing note  reviewed.  Constitutional:      Appearance: Normal appearance.  Cardiovascular:     Rate and Rhythm: Normal rate.  Pulmonary:     Effort: Pulmonary effort is normal.     Breath sounds: Normal breath sounds.  Neurological:     General: No focal deficit present.     Mental Status: She is alert and oriented to person, place, and time.  Psychiatric:        Mood and Affect: Mood normal.        Behavior: Behavior normal.        Thought Content: Thought content  normal.        Judgment: Judgment normal.    Per [facility] notes:          Assessment and Plan:  Assessment and Plan Assessment & Plan Ovarian cysts Ovarian cysts have grown, causing pelvic pressure. Ultrasound suggests benign nature, but surgery preferred due to growth and potential complications. Risks include injury to surrounding structures due to scar tissue from prior hysterectomy. - Schedule laparoscopic bilateral salpingo-oophorectomy after May 16th. - Discuss surgical risks, including injury to blood vessels and ureters. - Inform about possible conversion to open surgery. - Ensure awareness of outpatient procedure and one-week recovery. - Discuss potential need for blood transfusion and obtain consent. - Discuss resuscitation preferences and obtain consent.  Hysterectomy Scar tissue from prior hysterectomy may complicate ovarian removal.  Shoulder injury with tears Shoulder surgery pending. Timing considered in relation to ovarian cyst removal. - Coordinate scheduling of shoulder surgery with ovarian cyst removal.    Routine preventative health maintenance measures emphasized. Please refer to After Visit Summary for other counseling recommendations.   Follow-up: No follow-ups on file.      Kiki Pelton, MD Obstetrician & Gynecologist, Faculty Practice Minimally Invasive Gynecologic Surgery Center for [employer], [facility]

## 2024-03-14 NOTE — Progress Notes (Signed)
 PATIENT: Katie Jefferson MRN:  [id number] DOB:  1956-06-27 DATE OF SERVICE:  03/15/2024  Referring Physician:  Delice Lamar Bourbon, MD Primary Physician:  Lamar Bourbon Delice, MD  Chief Complaint:  Chief Complaint  Patient presents with  . Follow-up    Left shoulder    SUBJECTIVE:  Katie Jefferson is a 68 year old who presents for evaluation of her left shoulder.    She has experienced shoulder symptoms since sustaining a ground-level fall 7 months ago.  Primary complaint today is shoulder weakness and active range of motion deficits.  She is not having any shoulder pain.  Prior to this injury, the shoulder functioned normally.  She was initially evaluated by Dr. Donah.  Conservative treatment under his care includes a cortisone injection and physical therapy.  An MRI was obtained.  The patient has been referred to review the findings and discuss additional treatment options.  Past Medical History:  Diagnosis Date  . Arthritis   . Disease of thyroid gland   . GERD (gastroesophageal reflux disease)   . Hypertension   . Wears dentures    top and bottom    Past Surgical History:  Procedure Laterality Date  . Cesarean section    . Colonoscopy    . Hysterectomy  2000   for fibroids  . Repeat cesarean section     Codeine, Latex, Hydrocodone, and Metformin History reviewed. No pertinent family history. Social History   Social History Narrative  . Not on file    Review of Systems: See below for the review of systems which I have reviewed, edited and corrected.  OBJECTIVE:   VITALS:  Resp 18   Ht 5' 5 (1.651 m)   Wt 190 lb (86.2 kg)   BMI 31.62 kg/m   PHYSICAL EXAMINATION: General: alert & oriented, no acute distress.  Appears stated age. HEENT: normocephalic, atraumatic CV: extremities warm, well-perfused Resp: nonlabored Abdomen: soft  Left shoulder: Soft tissues intact.  No lesions or ulcerations.  Normal and symmetric appearance.  Active  range of motion was assessed: Forward elevation 80 degrees, external Tatian 30 degrees, internal rotation to the belt.  Passive forward elevation greater than 150 degrees with a soft endpoint.  Diminished rotator cuff strength.  The left upper extremity is neurovascularly intact.  IMAGING: X-rays left shoulder: No acute osseous abnormalities.  Well-preserved joint space.  MRI left shoulder demonstrates chronic appearing supraspinatus/infraspinatus tears with retraction to the glenoid and associated atrophy.  Degenerative changes involving the undersurface of the acromion.  Formal radiology interpretation: 1. Massive chronic rotator cuff tear with subscapularis, supraspinatus, and infraspinatus tendon retraction. Mild muscle atrophy. 2. Rotator cuff arthropathy (grade 3-4 glenohumeral chondromalacia). High riding humeral head abutting the acromion. 3. Medially dislocated biceps tendon. 4. Moderate AC joint arthrosis; lateral downsloping acromion.  ASSESSMENT:   1. Rotator cuff arthropathy of left shoulder     PLAN:   68 year old female with chronic and progressive worsening left weakness and functional deficits following a ground-level fall 7 months ago.   We reviewed images together today and discussed additional treatment options.  MRI demonstrates full-thickness supraspinatus/infraspinatus tears with retraction to the glenoid and associated atrophy.  These imaging characteristics are consistent with chronic/irreparable rotator cuff pathology.  Treatment options were discussed including continued conservative management versus surgical intervention.  The most reliable surgical option for irreparable rotator cuff pathology in the setting of glenohumeral degenerative changes would be a reverse total shoulder arthroplasty.  Conservative treatment would involve continued physical therapy with an emphasis on  deltoid strengthening exercises for improved active shoulder range of motion.  At  this point, she is not interested in having surgery but would like to continue working with PT.  We will have close follow-up in another 2 months to reassess her progress and have additional discussion regarding the plan going forward.  Voice recognition software and computer technology were used to transcribe portions of this note. Consent from the patient/caregiver was obtained prior to its use.  Please excuse transcription errors which do not reflect upon the quality of the medical record or the care provided.     Mark L. Schweppe, MD Orthopedic Sports Medicine Shoulder & Knee Surgery [facility] [address] [place], [place]  [address] [phone]  03/15/2024 2:19 PM  CC: Lamar Ernestine Bolder, MD

## 2024-03-20 ENCOUNTER — Encounter: Payer: Self-pay | Admitting: Obstetrics and Gynecology

## 2024-03-20 ENCOUNTER — Telehealth: Payer: Self-pay

## 2024-03-20 NOTE — Telephone Encounter (Signed)
 I called patient to see if she's available for surgery w/ Dr. Elester Grim on 05/08/24 at [facility]/ 11 am. Patient states she is available and would like to be scheduled.I provided pre-op instructions and surgery details over the phone.  I also confirmed insurance w/ patient. Subscriber ID [id number] [phone].

## 2024-04-30 ENCOUNTER — Encounter (HOSPITAL_COMMUNITY): Payer: Self-pay | Admitting: Obstetrics and Gynecology

## 2024-04-30 NOTE — Progress Notes (Addendum)
 Spoke w/ via phone for pre-op interview--- Katie Jefferson needs dos----   Surgeon orders requested. BMP, EKG, A1C and CBG per anesthesia.      Lab results------ COVID test -----patient states asymptomatic no test needed Arrive at -------0930 NPO after MN NO Solid Food.  Clear liquids from MN until---0830 Pre-Surgery Ensure or G2:  Med rec completed Medications to take morning of surgery -----Norvasc , Levothyroxine  and Metoprolol . Diabetic medication -----  GLP1 agonist last dose: GLP1 instructions:  Patient instructed no nail polish to be worn day of surgery Patient instructed to bring photo id and insurance card day of surgery Patient aware to have Driver (ride ) / caregiver    for 24 hours after surgery - [name] Patient Special Instructions ----- shower with antibacterial soap. Pt stated per surgeon instructions pt to hold ASA for 7 days prior to surgery. Pre-Op special Instructions -----  Patient verbalized understanding of instructions that were given at this phone interview. Patient denies chest pain, sob, fever, cough at the interview.

## 2024-05-08 ENCOUNTER — Other Ambulatory Visit (HOSPITAL_COMMUNITY): Payer: Self-pay

## 2024-05-08 ENCOUNTER — Encounter (HOSPITAL_COMMUNITY)
Admission: RE | Disposition: A | Discharge status: Home / Self Care | Payer: Self-pay | Source: Home / Self Care | Attending: Obstetrics and Gynecology

## 2024-05-08 ENCOUNTER — Ambulatory Visit (HOSPITAL_COMMUNITY): Payer: Self-pay | Admitting: Anesthesiology

## 2024-05-08 ENCOUNTER — Other Ambulatory Visit: Payer: Self-pay

## 2024-05-08 ENCOUNTER — Encounter (HOSPITAL_COMMUNITY): Payer: Self-pay | Admitting: Obstetrics and Gynecology

## 2024-05-08 ENCOUNTER — Ambulatory Visit (HOSPITAL_BASED_OUTPATIENT_CLINIC_OR_DEPARTMENT_OTHER): Payer: Self-pay | Admitting: Anesthesiology

## 2024-05-08 ENCOUNTER — Ambulatory Visit (HOSPITAL_COMMUNITY)
Admission: RE | Admit: 2024-05-08 | Discharge: 2024-05-08 | Disposition: A | Discharge status: Home / Self Care | Attending: Obstetrics and Gynecology | Admitting: Obstetrics and Gynecology

## 2024-05-08 DIAGNOSIS — I10 Essential (primary) hypertension: Secondary | ICD-10-CM

## 2024-05-08 DIAGNOSIS — Z79899 Other long term (current) drug therapy: Secondary | ICD-10-CM | POA: Diagnosis not present

## 2024-05-08 DIAGNOSIS — Z87891 Personal history of nicotine dependence: Secondary | ICD-10-CM | POA: Insufficient documentation

## 2024-05-08 DIAGNOSIS — N83292 Other ovarian cyst, left side: Secondary | ICD-10-CM | POA: Insufficient documentation

## 2024-05-08 DIAGNOSIS — E039 Hypothyroidism, unspecified: Secondary | ICD-10-CM | POA: Insufficient documentation

## 2024-05-08 DIAGNOSIS — N83201 Unspecified ovarian cyst, right side: Secondary | ICD-10-CM | POA: Diagnosis present

## 2024-05-08 DIAGNOSIS — K219 Gastro-esophageal reflux disease without esophagitis: Secondary | ICD-10-CM | POA: Insufficient documentation

## 2024-05-08 DIAGNOSIS — N83291 Other ovarian cyst, right side: Secondary | ICD-10-CM | POA: Insufficient documentation

## 2024-05-08 DIAGNOSIS — Z01818 Encounter for other preprocedural examination: Secondary | ICD-10-CM

## 2024-05-08 DIAGNOSIS — E119 Type 2 diabetes mellitus without complications: Secondary | ICD-10-CM | POA: Insufficient documentation

## 2024-05-08 DIAGNOSIS — Z302 Encounter for sterilization: Secondary | ICD-10-CM | POA: Diagnosis not present

## 2024-05-08 DIAGNOSIS — N83209 Unspecified ovarian cyst, unspecified side: Secondary | ICD-10-CM

## 2024-05-08 HISTORY — DX: Type 2 diabetes mellitus without complications: E11.9

## 2024-05-08 HISTORY — PX: CYSTOSCOPY: SHX5120

## 2024-05-08 HISTORY — PX: LAPAROSCOPIC BILATERAL SALPINGO OOPHERECTOMY: SHX5890

## 2024-05-08 HISTORY — PX: LAPAROSCOPIC LYSIS OF ADHESIONS: SHX5905

## 2024-05-08 HISTORY — DX: Hypothyroidism, unspecified: E03.9

## 2024-05-08 LAB — BASIC METABOLIC PANEL WITH GFR
Anion gap: 9 (ref 5–15)
BUN: 14 mg/dL (ref 8–23)
CO2: 24 mmol/L (ref 22–32)
Calcium: 9 mg/dL (ref 8.9–10.3)
Chloride: 107 mmol/L (ref 98–111)
Creatinine, Ser: 0.62 mg/dL (ref 0.44–1.00)
GFR, Estimated: >60 mL/min (ref >60)
Glucose, Bld: 108 mg/dL — ABNORMAL HIGH (ref 70–99)
Potassium: 3.9 mmol/L (ref 3.5–5.1)
Sodium: 140 mmol/L (ref 135–145)

## 2024-05-08 LAB — HEMOGLOBIN A1C
Hgb A1c MFr Bld: 6 % — ABNORMAL HIGH (ref 4.8–5.6)
Mean Plasma Glucose: 125.5 mg/dL

## 2024-05-08 LAB — GLUCOSE, CAPILLARY
Glucose-Capillary: 100 mg/dL — ABNORMAL HIGH (ref 70–99)
Glucose-Capillary: 113 mg/dL — ABNORMAL HIGH (ref 70–99)

## 2024-05-08 LAB — TYPE AND SCREEN
ABO/RH(D): A POS
Antibody Screen: NEGATIVE

## 2024-05-08 LAB — ABO/RH: ABO/RH(D): A POS

## 2024-05-08 SURGERY — SALPINGO-OOPHORECTOMY, BILATERAL, LAPAROSCOPIC
Anesthesia: General | Site: Pelvis

## 2024-05-08 MED ORDER — LIDOCAINE 2% (20 MG/ML) 5 ML SYRINGE
INTRAMUSCULAR | Status: AC
Start: 2024-05-08 — End: 2024-05-08
  Filled 2024-05-08: qty 5

## 2024-05-08 MED ORDER — PHENYLEPHRINE 80 MCG/ML (10ML) SYRINGE FOR IV PUSH (FOR BLOOD PRESSURE SUPPORT)
PREFILLED_SYRINGE | INTRAVENOUS | Status: DC | PRN
  Administered 2024-05-08: 80 ug via INTRAVENOUS

## 2024-05-08 MED ORDER — PHENYLEPHRINE HCL-NACL 20-0.9 MG/250ML-% IV SOLN
INTRAVENOUS | Status: DC | PRN
  Administered 2024-05-08: 40 ug/min via INTRAVENOUS

## 2024-05-08 MED ORDER — PROPOFOL 500 MG/50ML IV EMUL
INTRAVENOUS | Status: DC | PRN
  Administered 2024-05-08: 150 ug/kg/min via INTRAVENOUS
  Administered 2024-05-08: 115 ug/kg/min via INTRAVENOUS

## 2024-05-08 MED ORDER — PROPOFOL 10 MG/ML IV BOLUS
INTRAVENOUS | Status: DC | PRN
  Administered 2024-05-08: 160 mg via INTRAVENOUS

## 2024-05-08 MED ORDER — IBUPROFEN 600 MG PO TABS
600.0000 mg | ORAL_TABLET | Freq: Four times a day (QID) | ORAL | 0 refills | Status: AC | PRN
  Filled 2024-05-08: qty 90, 23d supply, fill #0

## 2024-05-08 MED ORDER — PHENYLEPHRINE 80 MCG/ML (10ML) SYRINGE FOR IV PUSH (FOR BLOOD PRESSURE SUPPORT)
PREFILLED_SYRINGE | INTRAVENOUS | Status: AC
  Filled 2024-05-08: qty 10

## 2024-05-08 MED ORDER — OXYCODONE HCL 5 MG/5ML PO SOLN: 5.0000 mg | Freq: Once | ORAL | Status: DC | PRN

## 2024-05-08 MED ORDER — ROCURONIUM BROMIDE 10 MG/ML (PF) SYRINGE
PREFILLED_SYRINGE | INTRAVENOUS | Status: DC | PRN
  Administered 2024-05-08: 50 mg via INTRAVENOUS
  Administered 2024-05-08: 20 mg via INTRAVENOUS
  Administered 2024-05-08: 10 mg via INTRAVENOUS
  Administered 2024-05-08 (×2): 20 mg via INTRAVENOUS

## 2024-05-08 MED ORDER — LACTATED RINGERS IV SOLN
INTRAVENOUS | Status: DC
Start: 2024-05-08 — End: 2024-05-08

## 2024-05-08 MED ORDER — 0.9 % SODIUM CHLORIDE (POUR BTL) OPTIME
TOPICAL | Status: DC | PRN
  Administered 2024-05-08: 1000 mL

## 2024-05-08 MED ORDER — EPHEDRINE SULFATE (PRESSORS) 50 MG/ML IJ SOLN
INTRAMUSCULAR | Status: DC | PRN
  Administered 2024-05-08: 5 mg via INTRAVENOUS

## 2024-05-08 MED ORDER — ENOXAPARIN SODIUM 40 MG/0.4ML IJ SOSY
PREFILLED_SYRINGE | INTRAMUSCULAR | Status: AC
  Filled 2024-05-08: qty 0.4

## 2024-05-08 MED ORDER — ACETAMINOPHEN 500 MG PO TABS
1000.0000 mg | ORAL_TABLET | ORAL | Status: AC
  Administered 2024-05-08: 1000 mg via ORAL

## 2024-05-08 MED ORDER — DEXAMETHASONE SODIUM PHOSPHATE 10 MG/ML IJ SOLN
INTRAMUSCULAR | Status: DC | PRN
  Administered 2024-05-08: 10 mg via INTRAVENOUS

## 2024-05-08 MED ORDER — HEMOSTATIC AGENTS (NO CHARGE) OPTIME
TOPICAL | Status: DC | PRN
  Administered 2024-05-08: 1 via TOPICAL

## 2024-05-08 MED ORDER — OXYCODONE HCL 5 MG PO TABS: 5.0000 mg | ORAL_TABLET | Freq: Once | ORAL | Status: DC | PRN

## 2024-05-08 MED ORDER — PROPOFOL 10 MG/ML IV BOLUS
INTRAVENOUS | Status: AC
  Filled 2024-05-08: qty 20

## 2024-05-08 MED ORDER — ENOXAPARIN SODIUM 40 MG/0.4ML IJ SOSY
40.0000 mg | PREFILLED_SYRINGE | INTRAMUSCULAR | Status: AC
  Administered 2024-05-08: 40 mg via SUBCUTANEOUS

## 2024-05-08 MED ORDER — INSULIN ASPART 100 UNIT/ML IJ SOLN: 0.0000 [IU] | INTRAMUSCULAR | Status: DC | PRN

## 2024-05-08 MED ORDER — FENTANYL CITRATE (PF) 250 MCG/5ML IJ SOLN
INTRAMUSCULAR | Status: AC
  Filled 2024-05-08: qty 5

## 2024-05-08 MED ORDER — POVIDONE-IODINE 10 % EX SWAB: 2.0000 | Freq: Once | CUTANEOUS | Status: DC

## 2024-05-08 MED ORDER — SODIUM CHLORIDE 0.9 % IR SOLN
Status: DC | PRN
  Administered 2024-05-08: 1000 mL via INTRAVESICAL

## 2024-05-08 MED ORDER — BUPIVACAINE HCL (PF) 0.5 % IJ SOLN
INTRAMUSCULAR | Status: AC
  Filled 2024-05-08: qty 30

## 2024-05-08 MED ORDER — DEXAMETHASONE SODIUM PHOSPHATE 10 MG/ML IJ SOLN
INTRAMUSCULAR | Status: AC
  Filled 2024-05-08: qty 1

## 2024-05-08 MED ORDER — AMISULPRIDE (ANTIEMETIC) 5 MG/2ML IV SOLN: 10.0000 mg | Freq: Once | INTRAVENOUS | Status: DC | PRN

## 2024-05-08 MED ORDER — CHLORHEXIDINE GLUCONATE 0.12 % MT SOLN
15.0000 mL | Freq: Once | OROMUCOSAL | Status: AC
  Administered 2024-05-08: 15 mL via OROMUCOSAL

## 2024-05-08 MED ORDER — ROCURONIUM BROMIDE 10 MG/ML (PF) SYRINGE
PREFILLED_SYRINGE | INTRAVENOUS | Status: AC
  Filled 2024-05-08: qty 10

## 2024-05-08 MED ORDER — LACTATED RINGERS IV SOLN: INTRAVENOUS | Status: DC

## 2024-05-08 MED ORDER — ONDANSETRON HCL 4 MG/2ML IJ SOLN
INTRAMUSCULAR | Status: DC | PRN
  Administered 2024-05-08: 4 mg via INTRAVENOUS

## 2024-05-08 MED ORDER — ONDANSETRON HCL 4 MG/2ML IJ SOLN
INTRAMUSCULAR | Status: AC
  Filled 2024-05-08: qty 2

## 2024-05-08 MED ORDER — FENTANYL CITRATE (PF) 250 MCG/5ML IJ SOLN
INTRAMUSCULAR | Status: DC | PRN
  Administered 2024-05-08 (×5): 50 ug via INTRAVENOUS

## 2024-05-08 MED ORDER — ORAL CARE MOUTH RINSE: 15.0000 mL | Freq: Once | OROMUCOSAL | Status: AC

## 2024-05-08 MED ORDER — ACETAMINOPHEN 500 MG PO TABS
ORAL_TABLET | ORAL | Status: AC
  Filled 2024-05-08: qty 2

## 2024-05-08 MED ORDER — OXYCODONE HCL 5 MG PO TABS
5.0000 mg | ORAL_TABLET | ORAL | 0 refills | Status: AC | PRN
  Filled 2024-05-08: qty 12, 2d supply, fill #0

## 2024-05-08 MED ORDER — SUGAMMADEX SODIUM 200 MG/2ML IV SOLN
INTRAVENOUS | Status: AC
  Filled 2024-05-08: qty 2

## 2024-05-08 MED ORDER — SUGAMMADEX SODIUM 200 MG/2ML IV SOLN
INTRAVENOUS | Status: DC | PRN
  Administered 2024-05-08: 200 mg via INTRAVENOUS

## 2024-05-08 MED ORDER — ACETAMINOPHEN 500 MG PO TABS: 1000.0000 mg | ORAL_TABLET | Freq: Four times a day (QID) | ORAL | Status: AC | PRN

## 2024-05-08 MED ORDER — EPHEDRINE 5 MG/ML INJ
INTRAVENOUS | Status: AC
  Filled 2024-05-08: qty 5

## 2024-05-08 MED ORDER — LIDOCAINE 2% (20 MG/ML) 5 ML SYRINGE
INTRAMUSCULAR | Status: DC | PRN
  Administered 2024-05-08: 100 mg via INTRAVENOUS

## 2024-05-08 MED ORDER — FENTANYL CITRATE (PF) 100 MCG/2ML IJ SOLN: 25.0000 ug | INTRAMUSCULAR | Status: DC | PRN

## 2024-05-08 MED ORDER — DEXMEDETOMIDINE HCL IN NACL 80 MCG/20ML IV SOLN
INTRAVENOUS | Status: DC | PRN
  Administered 2024-05-08: 4 ug via INTRAVENOUS
  Administered 2024-05-08 (×2): 8 ug via INTRAVENOUS

## 2024-05-08 MED ORDER — BUPIVACAINE HCL (PF) 0.5 % IJ SOLN
INTRAMUSCULAR | Status: DC | PRN
  Administered 2024-05-08: 13 mL

## 2024-05-08 MED ORDER — CHLORHEXIDINE GLUCONATE 0.12 % MT SOLN
OROMUCOSAL | Status: AC
  Filled 2024-05-08: qty 15

## 2024-05-08 SURGICAL SUPPLY — 46 items
APPLICATOR ARISTA FLEXITIP XL (MISCELLANEOUS) IMPLANT
APPLICATOR SURGIFLO ENDO (HEMOSTASIS) ×1 IMPLANT
CNTNR URN SCR LID CUP LEK RST (MISCELLANEOUS) ×3 IMPLANT
COVER MAYO STAND STRL (DRAPES) ×3 IMPLANT
DEFOGGER SCOPE WARM SEASHARP (MISCELLANEOUS) ×1 IMPLANT
DERMABOND ADVANCED .7 DNX12 (GAUZE/BANDAGES/DRESSINGS) ×3 IMPLANT
DRAPE SURG IRRIG POUCH 19X23 (DRAPES) ×3 IMPLANT
DURAPREP 26ML APPLICATOR (WOUND CARE) ×3 IMPLANT
ELECTRODE REM PT RTRN 9FT ADLT (ELECTROSURGICAL) ×3 IMPLANT
GAUZE 4X4 16PLY ~~LOC~~+RFID DBL (SPONGE) IMPLANT
GLOVE BIOGEL PI IND STRL 6.5 (GLOVE) ×1 IMPLANT
GLOVE BIOGEL PI IND STRL 7.0 (GLOVE) ×12 IMPLANT
GLOVE BIOGEL PI IND STRL 7.5 (GLOVE) ×1 IMPLANT
GLOVE BIOGEL PI MICRO STRL 7 (GLOVE) ×1 IMPLANT
GLOVE SURG SS PI 7.0 STRL IVOR (GLOVE) ×2 IMPLANT
GOWN STRL REUS W/ TWL LRG LVL3 (GOWN DISPOSABLE) ×4 IMPLANT
GOWN STRL REUS W/ TWL XL LVL3 (GOWN DISPOSABLE) ×6 IMPLANT
HEMOSTAT ARISTA ABSORB 3G PWDR (HEMOSTASIS) IMPLANT
IRRIGATION SUCT STRKRFLW 2 WTP (MISCELLANEOUS) IMPLANT
IV NS 1000ML BAXH (IV SOLUTION) ×2 IMPLANT
KIT PINK PAD W/HEAD ARM REST (MISCELLANEOUS) ×3 IMPLANT
KIT TURNOVER KIT B (KITS) ×3 IMPLANT
LIGASURE BLUNT TIP 5 LONG 44CM (ELECTROSURGICAL) ×1 IMPLANT
MANIFOLD NEPTUNE II (INSTRUMENTS) IMPLANT
NDL INSUFFLATION 14GA 120MM (NEEDLE) IMPLANT
NDL SPNL 22GX3.5 QUINCKE BK (NEEDLE) ×2 IMPLANT
NEEDLE INSUFFLATION 14GA 120MM (NEEDLE) IMPLANT
NEEDLE SPNL 22GX3.5 QUINCKE BK (NEEDLE) ×3 IMPLANT
NS IRRIG 1000ML POUR BTL (IV SOLUTION) ×3 IMPLANT
PACK LAPAROSCOPY BASIN (CUSTOM PROCEDURE TRAY) ×3 IMPLANT
PAD OB MATERNITY 11 LF (PERSONAL CARE ITEMS) ×3 IMPLANT
POUCH LAPAROSCOPIC INSTRUMENT (MISCELLANEOUS) ×3 IMPLANT
SET TUBE SMOKE EVAC HIGH FLOW (TUBING) ×3 IMPLANT
SHEARS HARMONIC 36 ACE (MISCELLANEOUS) ×1 IMPLANT
SLEEVE SCD COMPRESS KNEE MED (STOCKING) ×3 IMPLANT
SLEEVE Z-THREAD 5X100MM (TROCAR) ×7 IMPLANT
SURGIFLO W/THROMBIN 8M KIT (HEMOSTASIS) ×1 IMPLANT
SUT VIC AB 4-0 PS2 18 (SUTURE) ×3 IMPLANT
SUT VICRYL 0 UR6 27IN ABS (SUTURE) ×1 IMPLANT
SYR 50ML LL SCALE MARK (SYRINGE) ×1 IMPLANT
SYSTEM BAG RETRIEVAL 10MM (BASKET) ×1 IMPLANT
SYSTEM RETRIEVL 5MM INZII UNIV (BASKET) ×1 IMPLANT
TRAY FOLEY W/BAG SLVR 14FR LF (SET/KITS/TRAYS/PACK) ×3 IMPLANT
TROCAR 11X100 Z THREAD (TROCAR) ×1 IMPLANT
TROCAR Z-THREAD FIOS 5X100MM (TROCAR) ×3 IMPLANT
WARMER LAPAROSCOPE (MISCELLANEOUS) ×3 IMPLANT

## 2024-05-08 NOTE — H&P (Signed)
 OB/GYN Pre-Op History and Physical  Katie Jefferson is a 68 y.o. No obstetric history on file. presenting for surgical removal of both ovaries and remaining tubes. 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   Hypertension    Hypothyroidism    Insertional Achilles tendinopathy 04/05/2018   Insomnia    Menopause    Obesity    Ovarian cyst    Pain in joint, ankle and foot    Primary osteoarthritis of left knee 02/01/2024   Primary osteoarthritis of left shoulder 11/02/2023   Renal calculus    Tendonitis, Achilles    Traumatic rotator cuff tear, left, subsequent encounter 11/02/2023   Unilateral primary osteoarthritis, right knee 12/31/2019   Added automatically from request for surgery [id number]     Upper extremity pain     Past Surgical History:  Procedure Laterality Date   LAPAROSCOPIC TOTAL HYSTERECTOMY     REPLACEMENT TOTAL KNEE      OB History  No obstetric history on file.    Social History   Socioeconomic History   Marital status: Married    Spouse name: Not on file   Number of children: Not on file   Years of education: Not on file   Highest education level: Not on file  Occupational History   Not on file  Tobacco Use   Smoking status: Former    Types: Cigarettes   Smokeless tobacco: Never  Substance and Sexual Activity   Alcohol use: Not on file   Drug use: Not on file   Sexual activity: Not on file  Other Topics Concern   Not on file  Social History  Narrative   Not on file   Social Drivers of Health   Financial Resource Strain: Patient Declined (10/30/2023)   Received from [facility]   Overall Financial Resource Strain (CARDIA)    Difficulty of Paying Living Expenses: Patient declined  Food Insecurity: Patient Declined (10/30/2023)   Received from [facility]   Hunger Vital Sign    Within the past 12 months, you worried that your food would run out before you got the money to buy more.: Patient declined    Within the past 12 months, the food you bought just didn't last and you didn't have money to get more.: Patient declined  Transportation Needs: Patient Declined (10/30/2023)   Received from [facility] - Transportation    Lack of Transportation (Medical): Patient declined    Lack of Transportation (Non-Medical): Patient declined  Physical Activity: Sufficiently Active (10/30/2023)   Received from [facility]   Exercise Vital Sign    On average, how many days per week do you engage in moderate to strenuous exercise (like a brisk walk)?: 4 days    On average, how many minutes do you engage in exercise at this level?: 40 min  Stress: Patient Declined (10/30/2023)   Received from [facility]   [employer] [facility] Occupational Health - Occupational Stress Questionnaire    Feeling of Stress : Patient declined  Social Connections: Socially Integrated (10/30/2023)   Received from [employer]   Social Network    How would you rate your social network (family, work, friends)?: Good participation with social networks    Family History  Problem Relation Age of Onset   Hyperlipidemia Mother    Obesity Mother    Hyperlipidemia Father    Hypertension Father    Osteoarthritis Father    Heart Problems Father    CAD Father    Diabetes Sister    Heart failure Sister    Hypertension Sister    Heart failure Sister    Diabetes Sister    Hypertension Sister    Heart failure Sister    Diabetes Sister    Diabetes Brother     Cancer - Colon Paternal Grandmother    Breast cancer Paternal Aunt     Medications Prior to Admission  Medication Sig Dispense Refill Last Dose/Taking   amLODipine  (NORVASC ) 2.5 MG tablet TAKE 1 TABLET BY MOUTH EVERY DAY 90 tablet 3 05/08/2024 at  7:30 AM   aspirin  EC 81 MG tablet Take 1 tablet (81 mg total) by mouth daily. Swallow whole.   04/29/2024   B Complex Vitamins (VITAMIN B-COMPLEX) TABS Take 1 tablet by mouth daily at 6 (six) AM.   Past Week   cetirizine  (ALLERGY, CETIRIZINE ,) 10 MG tablet TAKE 1 TABLET BY MOUTH DAILY AS NEEDED 100 tablet 3 Past Week   Cod Liver Oil CAPS Take 1 capsule by mouth daily at 6 (six) AM.   Past Week   Coenzyme Q10 (CO Q-10) 400 MG CAPS Take 1 capsule by mouth daily at 6 (six) AM.   Past Week   fluticasone (FLONASE ALLERGY RELIEF) 50 MCG/ACT nasal spray 1 spray as needed.   Past Month   levothyroxine  (SYNTHROID ) 75 MCG tablet TAKE 1 TABLET BY MOUTH EVERY DAY IN THE MORNING ON AN EMPTY STOMACH 90 tablet 3 05/08/2024 at  7:30 AM   metoprolol  tartrate (LOPRESSOR ) 25 MG tablet Take 25 mg by mouth daily.   05/08/2024 at  7:30 AM   omeprazole  (PRILOSEC) 20 MG capsule TAKE 1 CAPSULE BY MOUTH DAILY 90 capsule 1 Past Month   omeprazole  (PRILOSEC) 20 MG capsule Take 1 capsule (20 mg total) by mouth daily. 30 capsule 0 Past Month   rosuvastatin  (CRESTOR ) 20 MG tablet Take 1 tablet (20 mg total) by mouth daily. 30 tablet 0 05/08/2024 at  7:30 AM   TURMERIC PO Take 2 tablets by mouth daily at 6 (six) AM.   Past Week   valsartan -hydrochlorothiazide  (DIOVAN -HCT) 80-12.5 MG tablet 1 tablet Orally Once a day 90 days (Patient taking differently: Takes 1/2 tab) 90 tablet 3 05/06/2024   amoxicillin  (AMOXIL ) 500 MG capsule Take 4 caps by mouth 30-60 minutes prior to dental procedure. 4 capsule 2 More than a month   Ashwagandha 500 MG CAPS Take 2 capsules by mouth daily at 6 (six) AM.   More than a month    Allergies  Allergen Reactions   Codeine Nausea And Vomiting   Latex Rash    Hydrocodone Nausea And Vomiting and Nausea Only   Metformin Other (See Comments)    Review of Systems: Negative except for what is mentioned in HPI.     Physical Exam: BP 123/84   Pulse 63   Temp 98.1 F (36.7 C) (Oral)   Resp  17   Ht 5' 4 (1.626 m)   Wt 83.5 kg   SpO2 100%   BMI 31.58 kg/m  CONSTITUTIONAL: Well-developed, well-nourished and in no acute distress.  HENT:  Normocephalic, atraumatic, External right and left ear normal. Oropharynx is clear and moist EYES: Conjunctivae and EOM are normal. Pupils are equal, round, and reactive to light. No scleral icterus.  NECK: Normal range of motion, supple, no masses SKIN: Skin is warm and dry. No rash noted. Not diaphoretic. No erythema. No pallor. NEUROLGIC: Alert and oriented to person, place, and time. Normal reflexes, muscle tone coordination. No cranial nerve deficit noted. PSYCHIATRIC: Normal mood and affect. Normal behavior. Normal judgment and thought content. RESPIRATORY: Normal effort PELVIC: Deferred   Pertinent Labs/Studies:   No results found for this or any previous visit (from the past 72 hours).     Assessment and Plan :Athleen Feltner is a 68 y.o. No obstetric history on file. here for surgical management of ovarian cyst.   Patient desires surgical management with laparoscopic bilateral salpingo-oophorectomy.  The risks of surgery were discussed in detail with the patient including but not limited to: bleeding which may require transfusion or reoperation; infection which may require prolonged hospitalization or re-hospitalization and antibiotic therapy; injury to bowel, bladder, ureters and major vessels or other surrounding organs which may lead to other procedures; formation of adhesions; need for additional procedures including laparotomy or subsequent procedures secondary to intraoperative injury or abnormal pathology; thromboembolic phenomenon; incisional problems and other postoperative or anesthesia  complications.  Patient was told that the likelihood that her condition and symptoms will be treated effectively with this surgical management was high; the postoperative expectations were also discussed in detail. The patient also understands the alternative treatment options which were discussed in full. All questions were answered.   Printed patient education handouts about the procedure were given to the patient to review at home.    Quantasia Stegner, M.D. Minimally Invasive Gynecologic Surgery and Pelvic Pain Specialist Attending Obstetrician & Gynecologist, Faculty Practice Center for [employer], [facility]

## 2024-05-08 NOTE — Op Note (Signed)
 Marshall Ligas PROCEDURE DATE: 05/08/2024  PREOPERATIVE DIAGNOSIS: ovarian cyst  POSTOPERATIVE DIAGNOSIS: ovarian cyst PROCEDURE:    laparoscopic bilateral salpingo-oophorectomy, lysis of adhesions SURGEON: Marquia Costello, MD ASSISTANT:  Vina Lis, MD    An experienced assistant was required given the standard of surgical care given the complexity of the case.  This assistant was needed for exposure, dissection, suctioning, retraction, instrument exchange, and for overall help during the procedure.  INDICATIONS: 68 y.o. No obstetric history on file. with an ovarian cyst.  Risks of surgery were discussed with the patient including but not limited to: bleeding which may require transfusion; infection which may require antibiotics; injury to surrounding organs; need for additional procedures including laparotomy;  and other postoperative/anesthesia complications. Written informed consent was obtained.    FINDINGS:  Normal external genitalia, atrophic vaginal introitus, normal appearing urethral meatus Laparoscopically: normal upper abdominal survey, normal appearing appendix, left fallopian tube remnant attached to ovary which was within the adhesion of the vaginal cuff, bladder, cyst, right ovary and right fallopian tube and part of posterior cuff, bilateral ureters were visualized Cystoscopically: normal bladder wall without apparent injury, bilateral ureteral orifices, urine from bilateral ureteral orifices   ANESTHESIA: General INTRAVENOUS FLUIDS:  1000 ml of LR ESTIMATED BLOOD LOSS:  25 ml URINE OUTPUT: 250 ml SPECIMENS: pelvic washings, bilateral ovaries with fallopian tubes COMPLICATIONS:  None immediate.   PROCEDURE IN DETAIL:  The patient had sequential compression devices applied to her lower extremities while in the preoperative area.  She was then taken to the operating room where general anesthesia was administered and was found to be adequate.  She was placed in the dorsal  lithotomy position, and was prepped and draped in a sterile manner.  A Foley catheter was inserted into her bladder and attached to constant drainage.  After an adequate timeout was performed, attention was then turned to the patient's abdomen where a 5mm skin incision was made in the umbilicus.  The laparoscope and optiview trocar were carefully introduced into the peritoneal cavity and intraabdominal entry was confirmed on visualization.  Intraperitoneal placement was confirmed by low opening intraabdominal pressure with insufflation of carbon dioxide gas.  Adequate pneumoperitoneum was obtained and the upper abdomen surveyed. The patient was then  placed in steep trendelenburg. A survey of the patient's pelvis and abdomen revealed the findings above.   Bilateral 5-mm lower quadrant ports and a 5mm port in the RUQ were then placed under direct visualization. The harmonic device was used to sharply incise the peritoneum to enter the retroperitoneal space and the IP ligament elevated from the lateral side wall.  The fallopian tube edge and ovary were dissected bluntly from the lateral side wall and the IP ligament identified. The the IP ligament was moved away from the side wall and the peritoneum was divided and a window was made in the peritoneum and the IP ligament was cauterized and divided using the ligasure. The peritoneum was then dissected towards the ovarian/cyst complex. The ovary was bluntly and sharply dissected. The bladder was then back-filled to allow for continued dissection. Attention was then turned to the left side. The fallopian tube was elevated and the peritoneum superiorly was incised. The fallopian tube was dissected away from the side wall and the IP ligament was then identified and elevated away from the side wall. The ureter was identified and then the IP ligament cauterized and divided. The peritoneum was divided inferior to the ovary until posterior cul de sac reached. Attention  returned to  the right side and the blunt and sharp dissection was employed until the ovarian-cyst complex was completely separated from the bladder and then the vaginal cuff. Then able to dissect and separate the specimen from the posterior cul de sac and remained intact. At this time, the umbilical trocar was removed and the incision extended to allow a 10mm trocar to be introduced. A 10mm endocatch was introduced and the specimen placed in the bag. The bag was then brought through the incision and the specimen was ruptured within the bag and the contents noted to be clear. The specimen was passed off for pathology. Gloves were changed and the pelvis was inspected at high and low pressure, surgiflow applied to the surgical site, and there was adequate hemostasis.   No intraoperative injury to other surrounding organs was noted. Given the close proximity to the ureters and the bladder, decision made to proceed with cystoscopy. The foley catheter was removed and a cystoscope was introduced and urine was seen ejecting from the bilateral ureteral orifices. The bladder wall appeared intact. Incidentally there appeared to be sediment within the bladder. The cystoscopy was completed and after scrubbing, attention returned to the abdomen and the fascia was closed with 0 vicryl in a running fashion. All skin incisions were closed with 4-0 Vicryl subcuticular stitches/Dermabond.    Carter Quarry, MD Minimally Invasive Gynecologic Surgery  Obstetrics and Gynecology, [facility] for [facility], [facility] 05/08/2024

## 2024-05-08 NOTE — Anesthesia Procedure Notes (Addendum)
 Procedure Name: Intubation Date/Time: 05/08/2024 10:21 AM  Performed by: Shlomo Tinnie SAILOR, RNPre-anesthesia Checklist: Patient identified, Emergency Drugs available, Suction available and Patient being monitored Patient Re-evaluated:Patient Re-evaluated prior to induction Oxygen Delivery Method: Circle System Utilized Preoxygenation: Pre-oxygenation with 100% oxygen Induction Type: IV induction Ventilation: Mask ventilation without difficulty Laryngoscope Size: Mac and 3 Grade View: Grade I Tube type: Oral Tube size: 7.0 mm Number of attempts: 1 Airway Equipment and Method: Stylet and Oral airway Placement Confirmation: ETT inserted through vocal cords under direct vision, positive ETCO2 and breath sounds checked- equal and bilateral Secured at: 22 cm Tube secured with: Tape Dental Injury: Teeth and Oropharynx as per pre-operative assessment

## 2024-05-08 NOTE — Transfer of Care (Signed)
 Immediate Anesthesia Transfer of Care Note  Patient: Iver Fehrenbach  Procedure(s) Performed: SALPINGO-OOPHORECTOMY, BILATERAL, LAPAROSCOPIC (Pelvis) LYSIS, ADHESIONS, LAPAROSCOPIC (Pelvis) CYSTOSCOPY (Bladder)  Patient Location: PACU  Anesthesia Type:General  Level of Consciousness: drowsy and patient cooperative  Airway & Oxygen Therapy: Patient Spontanous Breathing and Patient connected to face mask oxygen  Post-op Assessment: Report given to RN, Post -op Vital signs reviewed and stable, and Patient moving all extremities  Post vital signs: Reviewed and stable  Last Vitals:  Vitals Value Taken Time  BP    Temp    Pulse 63 05/08/24 14:00  Resp 14 05/08/24 14:00  SpO2 100 % 05/08/24 14:00  Vitals shown include unfiled device data.  Last Pain:  Vitals:   05/08/24 0928  TempSrc: Oral  PainSc: 0-No pain      Patients Stated Pain Goal: 5 (05/08/24 9071)  Complications: No notable events documented.

## 2024-05-08 NOTE — Discharge Instructions (Signed)
 Post-surgical Instructions, Outpatient Surgery  You may expect to feel dizzy, weak, and drowsy for as long as 24 hours after receiving the medicine that made you sleep (anesthetic). For the first 24 hours after your surgery:   Do not drive a car, ride a bicycle, participate in physical activities, or take public transportation until you are done taking narcotic pain medicines or as directed by Dr. Jeralyn.  Do not drink alcohol or take tranquilizers.  Do not take medicine that has not been prescribed by your physicians.  Do not sign important papers or make important decisions while on narcotic pain medicines.  Have a responsible person with you.   CARE OF INCISION If you have a bandage, you may remove it in one day.  If there are steri-strips or dermabond, just let this loosen on its own.  You may shower on the first day after your surgery.  Do not sit in a tub bath for one week. Avoid heavy lifting (more than 10 pounds/4.5 kilograms), pushing, or pulling.  Avoid activities that may risk injury to your incisions.   PAIN MANAGEMENT Ibuprofen  800mg .  (This is the same as 4-200mg  over the counter tablets of Motrin  or ibuprofen .)  Take this every 6 hours or as needed for cramping.   Acetaminophen  1000mg  (This is the same as 2-500mg  over the counter extra strength tylenol ). Take this every 6 hours for the first 3 days or as needed afterwards for pain Oxycodone  5mg  For more severe pain, take one or two tablets every four to six hours as needed for pain control.  (Remember that narcotic pain medications increase your risk of constipation.  If this becomes a problem, you may take an over the counter laxative like miralax.)  DO'S AND DON'T'S You may shower on the first day after your surgery Do not do any heavy lifting for at least two weeks.  This increases the chance of bleeding. Do move around as you feel able.  Stairs are fine.  You may begin to exercise again as you feel able.  Do not lift any  weights for two weeks. Do not put anything in the vagina for two weeks--no tampons, intercourse, or douching.    REGULAR MEDIATIONS/VITAMINS: You may restart all of your regular medications as prescribed. You may restart all of your vitamins as you normally take them.    PLEASE CALL OR SEEK MEDICAL CARE IF: You have persistent nausea and vomiting.  You have trouble eating or drinking.  You have an oral temperature above 100.5.  You have constipation that is not helped by adjusting diet or increasing fluid intake. Pain medicines are a common cause of constipation.  You have heavy vaginal bleeding You have redness or drainage from your incision(s) or there is increasing pain or tenderness near or in the surgical site.

## 2024-05-08 NOTE — Anesthesia Postprocedure Evaluation (Signed)
 Anesthesia Post Note  Patient: Katie Jefferson  Procedure(s) Performed: SALPINGO-OOPHORECTOMY, BILATERAL, LAPAROSCOPIC (Pelvis) LYSIS, ADHESIONS, LAPAROSCOPIC (Pelvis) CYSTOSCOPY (Bladder)     Patient location during evaluation: PACU Anesthesia Type: General Level of consciousness: awake Pain management: pain level controlled Vital Signs Assessment: post-procedure vital signs reviewed and stable Respiratory status: spontaneous breathing, nonlabored ventilation and respiratory function stable Cardiovascular status: blood pressure returned to baseline and stable Postop Assessment: no apparent nausea or vomiting Anesthetic complications: no   No notable events documented.  Last Vitals:  Vitals:   05/08/24 1354 05/08/24 1415  BP: (!) 128/55 138/86  Pulse:  62  Resp:    Temp: 36.7 C   SpO2:      Last Pain:  Vitals:   05/08/24 1415  TempSrc:   PainSc: Asleep                 Delon Aisha Arch

## 2024-05-08 NOTE — Anesthesia Preprocedure Evaluation (Addendum)
 Anesthesia Evaluation  Patient identified by MRN, date of birth, ID band Patient awake    Reviewed: Allergy & Precautions, NPO status , Patient's Chart, lab work & pertinent test results  History of Anesthesia Complications Negative for: history of anesthetic complications  Airway Mallampati: III  TM Distance: >3 FB Neck ROM: Full    Dental  (+) Dental Advisory Given, Missing   Pulmonary neg shortness of breath, neg sleep apnea, neg COPD, Recent URI  (sinus infection 2 weeks ago, took antibiotics), former smoker   Pulmonary exam normal breath sounds clear to auscultation       Cardiovascular hypertension (amlodipine , metoprolol , valsartan -HCTZ), Pt. on medications and Pt. on home beta blockers (-) angina (-) Past MI, (-) Cardiac Stents and (-) CABG + dysrhythmias (palpitations)  Rhythm:Regular Rate:Normal     Neuro/Psych  Headaches, neg Seizures PSYCHIATRIC DISORDERS  Depression       GI/Hepatic Neg liver ROS,GERD  Medicated,,  Endo/Other  diabetes, Type 2Hypothyroidism    Renal/GU negative Renal ROS     Musculoskeletal  (+) Arthritis , Osteoarthritis,    Abdominal  (+) + obese  Peds  Hematology negative hematology ROS (+) Lab Results      Component                Value               Date                      WBC                      7.5                 01/20/2023                HGB                      12.7                01/20/2023                HCT                      38.5                01/20/2023                MCV                      88.9                01/20/2023                PLT                      243                 01/20/2023              Anesthesia Other Findings   Reproductive/Obstetrics                              Anesthesia Physical Anesthesia Plan  ASA: 3  Anesthesia Plan: General   Post-op Pain Management: Tylenol  PO (pre-op)*   Induction:  Intravenous  PONV Risk Score and Plan: 3 and Ondansetron , Dexamethasone  and  Treatment may vary due to age or medical condition  Airway Management Planned: Oral ETT  Additional Equipment:   Intra-op Plan:   Post-operative Plan: Extubation in OR  Informed Consent: I have reviewed the patients History and Physical, chart, labs and discussed the procedure including the risks, benefits and alternatives for the proposed anesthesia with the patient or authorized representative who has indicated his/her understanding and acceptance.     Dental advisory given  Plan Discussed with: CRNA and Anesthesiologist  Anesthesia Plan Comments: (Risks of general anesthesia discussed including, but not limited to, sore throat, hoarse voice, chipped/damaged teeth, injury to vocal cords, nausea and vomiting, allergic reactions, lung infection, heart attack, stroke, and death. All questions answered. )         Anesthesia Quick Evaluation

## 2024-05-08 NOTE — Brief Op Note (Signed)
 05/08/2024  1:40 PM  PATIENT:  Katie Jefferson  68 y.o. female  PRE-OPERATIVE DIAGNOSIS:  Cyst right ovary Undesired fertility  POST-OPERATIVE DIAGNOSIS:  Cyst right ovary Undesired fertility  PROCEDURE:  Procedure(s): SALPINGO-OOPHORECTOMY, BILATERAL, LAPAROSCOPIC LYSIS, ADHESIONS, LAPAROSCOPIC CYSTOSCOPY  SURGEON:  Surgeons and Role:    DEWAINE Jeralyn Crutch, MD - Primary    * Cleatus Moccasin, MD - Assisting  PHYSICIAN ASSISTANT: n/a  ASSISTANTS: above   ANESTHESIA:   local and general  EBL:  25 mL   BLOOD ADMINISTERED:none  DRAINS: Urinary Catheter (Foley) - removed at close of case  LOCAL MEDICATIONS USED:  BUPIVICAINE   SPECIMEN:  Source of Specimen:  bilateral ovaries and tubes  DISPOSITION OF SPECIMEN:  PATHOLOGY  COUNTS:  YES  TOURNIQUET:  * No tourniquets in log *  DICTATION: .Note written in EPIC  PLAN OF CARE: Discharge to home after PACU  PATIENT DISPOSITION:  PACU - hemodynamically stable.   Delay start of Pharmacological VTE agent (>24hrs) due to surgical blood loss or risk of bleeding: not applicable

## 2024-05-09 ENCOUNTER — Encounter (HOSPITAL_COMMUNITY): Payer: Self-pay | Admitting: Obstetrics and Gynecology

## 2024-05-10 LAB — CYTOLOGY - NON PAP

## 2024-05-10 LAB — SURGICAL PATHOLOGY

## 2024-05-11 ENCOUNTER — Ambulatory Visit: Payer: Self-pay | Admitting: Obstetrics and Gynecology

## 2024-05-22 NOTE — Progress Notes (Signed)
 PATIENT: Katie Jefferson MRN:  [id number] DOB:  12/23/1955 DATE OF SERVICE:  05/22/2024  Referring Physician:  Delice Lamar Bourbon, MD Primary Physician:  Lamar Bourbon Delice, MD  Chief Complaint:  Chief Complaint  Patient presents with  . Follow-up    Left shoulder    SUBJECTIVE:  Katie Jefferson returns for follow-up evaluation of her left shoulder.  She has known rotator cuff arthropathy and has been working with physical therapy.  Overall, she is doing okay from the standpoint of the shoulder.  Symptoms are tolerable in the current state.  Past Medical History:  Diagnosis Date  . Arthritis   . Disease of thyroid gland   . GERD (gastroesophageal reflux disease)   . Hypertension   . Wears dentures    top and bottom    Past Surgical History:  Procedure Laterality Date  . Cesarean section    . Colonoscopy    . Hysterectomy  2000   for fibroids  . Repeat cesarean section     Codeine, Latex, Hydrocodone, and Metformin History reviewed. No pertinent family history. Social History   Social History Narrative  . Not on file    Review of Systems: See below for the review of systems which I have reviewed, edited and corrected.  OBJECTIVE:   VITALS:  Resp 18   Ht 5' 5 (1.651 m)   Wt 190 lb (86.2 kg)   BMI 31.62 kg/m   PHYSICAL EXAMINATION: General: alert & oriented, no acute distress.  Appears stated age. HEENT: normocephalic, atraumatic CV: extremities warm, well-perfused Resp: nonlabored Abdomen: soft  Left shoulder: Soft tissues intact.  No lesions or ulcerations.  Normal and symmetric appearance.  Active forward elevation to approximate 100 degrees, limited by pain.  Passive forward elevation greater than 150 degrees.  Diminished rotator cuff strength.  The left upper extremity is neurovascularly intact.  ASSESSMENT:   1. Rotator cuff arthropathy of left shoulder     PLAN:   At this point, shoulder symptoms are tolerable in the current  state.  She is not interested in any additional intervention.  We will be happy to see her back on an as-needed basis at her discretion.  She was encouraged to contact the clinic accordingly.  All questions were answered. The patient expressed understanding and agreement with the above outlined treatment plan.    Voice recognition software and computer technology were used to transcribe portions of this note. Consent from the patient/caregiver was obtained prior to its use.  Please excuse transcription errors which do not reflect upon the quality of the medical record or the care provided.     Mark L. Schweppe, MD Orthopedic Sports Medicine Shoulder & Knee Surgery [facility] [address] [place], [place]  [address] [phone]  05/22/2024 11:11 AM  CC: Lamar Bourbon Delice, MD

## 2024-05-22 NOTE — Progress Notes (Signed)
Review of Systems   Musculoskeletal: Positive for joint pain.   All other systems reviewed and are negative.

## 2024-05-30 ENCOUNTER — Ambulatory Visit (INDEPENDENT_AMBULATORY_CARE_PROVIDER_SITE_OTHER): Admitting: Obstetrics and Gynecology

## 2024-05-30 ENCOUNTER — Other Ambulatory Visit: Payer: Self-pay

## 2024-05-30 ENCOUNTER — Encounter: Payer: Self-pay | Admitting: Obstetrics and Gynecology

## 2024-05-30 VITALS — BP 115/77 | HR 58 | Wt 194.3 lb

## 2024-05-30 DIAGNOSIS — Z1331 Encounter for screening for depression: Secondary | ICD-10-CM | POA: Diagnosis not present

## 2024-05-30 DIAGNOSIS — Z09 Encounter for follow-up examination after completed treatment for conditions other than malignant neoplasm: Secondary | ICD-10-CM | POA: Diagnosis not present

## 2024-05-30 DIAGNOSIS — N83201 Unspecified ovarian cyst, right side: Secondary | ICD-10-CM

## 2024-05-30 NOTE — Progress Notes (Unsigned)
   POSTOPERATIVE VISIT NOTE   Subjective:     Katie Jefferson is a 68 y.o. No obstetric history on file. who presents to the clinic 3 weeks status post bilateral oophorectomy for adnexal mass. Eating a regular diet without difficulty. Bowel movements are normal. The patient is not having any pain. Incision: doing well  Vaginal bleeding: none  The following portions of the patient's history were reviewed and updated as appropriate: allergies, current medications, past family history, past medical history, past social history, past surgical history, and problem list..   Review of Systems Pertinent items are noted in HPI.    Objective:    BP 115/77   Pulse (!) 58   Wt 194 lb 4.8 oz (88.1 kg)   BMI 33.35 kg/m  General:  alert, cooperative, and no distress  Abdomen: soft, bowel sounds active, non-tender  Incision:   healing well, no drainage, no erythema, no hernia, no seroma, no swelling, no dehiscence, incision well approximated  Pelvic:   Exam deferred.    Pathology Results: FINAL MICROSCOPIC DIAGNOSIS:   A. 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  - Contralateral ovary with cortical inclusion cysts.  - Two segments of fallopian tube.  - Negative for malignancy.    Assessment:   Doing well postoperatively. Operative findings again reviewed. Pathology report discussed.   Plan:    1. Postop check (Primary) Doing well   2. Cyst of right ovary Reviewed pathology and reviewed intraoperative pathology   Activity restrictions: none Follow up: as needed  Katie Bivens, MD Obstetrician & Gynecologist, [facility] for [employer], [facility]

## 2024-08-01 ENCOUNTER — Other Ambulatory Visit: Payer: Self-pay | Admitting: Internal Medicine

## 2024-08-01 DIAGNOSIS — Z1231 Encounter for screening mammogram for malignant neoplasm of breast: Secondary | ICD-10-CM

## 2024-09-05 ENCOUNTER — Ambulatory Visit
Admission: RE | Admit: 2024-09-05 | Discharge: 2024-09-05 | Disposition: A | Discharge status: Home / Self Care | Source: Ambulatory Visit | Attending: Internal Medicine | Admitting: Internal Medicine

## 2024-09-05 DIAGNOSIS — Z1231 Encounter for screening mammogram for malignant neoplasm of breast: Secondary | ICD-10-CM
